# Patient Record
Sex: Female | Born: 1975 | Race: Black or African American | Hispanic: No | Smoking: Former smoker
Health system: Southern US, Community
[De-identification: ages and names within clinical notes are randomized; demographics above are authoritative.]

## PROBLEM LIST (undated history)

## (undated) DIAGNOSIS — F1120 Opioid dependence, uncomplicated: Secondary | ICD-10-CM

## (undated) DIAGNOSIS — C801 Malignant (primary) neoplasm, unspecified: Secondary | ICD-10-CM

## (undated) DIAGNOSIS — C50919 Malignant neoplasm of unspecified site of unspecified female breast: Secondary | ICD-10-CM

## (undated) DIAGNOSIS — Z8042 Family history of malignant neoplasm of prostate: Secondary | ICD-10-CM

## (undated) DIAGNOSIS — Z9289 Personal history of other medical treatment: Secondary | ICD-10-CM

## (undated) DIAGNOSIS — F319 Bipolar disorder, unspecified: Secondary | ICD-10-CM

## (undated) DIAGNOSIS — D649 Anemia, unspecified: Secondary | ICD-10-CM

## (undated) HISTORY — DX: Bipolar disorder, unspecified: F31.9

## (undated) HISTORY — DX: Family history of malignant neoplasm of prostate: Z80.42

## (undated) HISTORY — DX: Opioid dependence, uncomplicated: F11.20

## (undated) HISTORY — DX: Anemia, unspecified: D64.9

## (undated) HISTORY — DX: Personal history of other medical treatment: Z92.89

## (undated) HISTORY — DX: Malignant neoplasm of unspecified site of unspecified female breast: C50.919

## (undated) HISTORY — PX: DILATION AND CURETTAGE OF UTERUS: SHX78

---

## 1997-09-26 ENCOUNTER — Inpatient Hospital Stay (HOSPITAL_COMMUNITY): Admission: AD | Admit: 1997-09-26 | Discharge: 1997-09-30 | Payer: Self-pay | Admitting: Obstetrics

## 1998-06-13 ENCOUNTER — Inpatient Hospital Stay (HOSPITAL_COMMUNITY): Admission: AD | Admit: 1998-06-13 | Discharge: 1998-06-15 | Payer: Self-pay | Admitting: *Deleted

## 1999-04-06 ENCOUNTER — Emergency Department (HOSPITAL_COMMUNITY): Admission: EM | Admit: 1999-04-06 | Discharge: 1999-04-06 | Payer: Self-pay | Admitting: Emergency Medicine

## 2000-02-10 ENCOUNTER — Emergency Department (HOSPITAL_COMMUNITY): Admission: EM | Admit: 2000-02-10 | Discharge: 2000-02-10 | Payer: Self-pay | Admitting: Emergency Medicine

## 2000-06-04 ENCOUNTER — Ambulatory Visit (HOSPITAL_COMMUNITY): Admission: RE | Admit: 2000-06-04 | Discharge: 2000-06-04 | Payer: Self-pay | Admitting: Internal Medicine

## 2000-06-04 ENCOUNTER — Encounter: Payer: Self-pay | Admitting: Internal Medicine

## 2000-07-18 ENCOUNTER — Inpatient Hospital Stay (HOSPITAL_COMMUNITY): Admission: AD | Admit: 2000-07-18 | Discharge: 2000-07-18 | Payer: Self-pay | Admitting: Obstetrics & Gynecology

## 2000-07-21 ENCOUNTER — Inpatient Hospital Stay (HOSPITAL_COMMUNITY): Admission: AD | Admit: 2000-07-21 | Discharge: 2000-07-21 | Payer: Self-pay | Admitting: Obstetrics

## 2000-07-23 ENCOUNTER — Inpatient Hospital Stay (HOSPITAL_COMMUNITY): Admission: AD | Admit: 2000-07-23 | Discharge: 2000-07-23 | Payer: Self-pay | Admitting: *Deleted

## 2000-07-27 ENCOUNTER — Inpatient Hospital Stay (HOSPITAL_COMMUNITY): Admission: AD | Admit: 2000-07-27 | Discharge: 2000-07-27 | Payer: Self-pay | Admitting: Obstetrics

## 2000-07-27 ENCOUNTER — Encounter: Payer: Self-pay | Admitting: *Deleted

## 2000-08-01 ENCOUNTER — Inpatient Hospital Stay (HOSPITAL_COMMUNITY): Admission: AD | Admit: 2000-08-01 | Discharge: 2000-08-01 | Payer: Self-pay | Admitting: Obstetrics

## 2000-09-11 ENCOUNTER — Inpatient Hospital Stay (HOSPITAL_COMMUNITY): Admission: AD | Admit: 2000-09-11 | Discharge: 2000-09-11 | Payer: Self-pay | Admitting: *Deleted

## 2000-11-05 ENCOUNTER — Inpatient Hospital Stay (HOSPITAL_COMMUNITY): Admission: AD | Admit: 2000-11-05 | Discharge: 2000-11-05 | Payer: Self-pay | Admitting: *Deleted

## 2000-11-08 ENCOUNTER — Ambulatory Visit (HOSPITAL_COMMUNITY): Admission: RE | Admit: 2000-11-08 | Discharge: 2000-11-08 | Payer: Self-pay | Admitting: Obstetrics

## 2001-01-31 ENCOUNTER — Inpatient Hospital Stay (HOSPITAL_COMMUNITY): Admission: AD | Admit: 2001-01-31 | Discharge: 2001-01-31 | Payer: Self-pay | Admitting: Obstetrics & Gynecology

## 2001-01-31 ENCOUNTER — Encounter: Payer: Self-pay | Admitting: Obstetrics & Gynecology

## 2001-02-27 ENCOUNTER — Inpatient Hospital Stay (HOSPITAL_COMMUNITY): Admission: AD | Admit: 2001-02-27 | Discharge: 2001-02-27 | Payer: Self-pay | Admitting: Obstetrics & Gynecology

## 2001-03-05 ENCOUNTER — Inpatient Hospital Stay (HOSPITAL_COMMUNITY): Admission: AD | Admit: 2001-03-05 | Discharge: 2001-03-08 | Payer: Self-pay | Admitting: Obstetrics

## 2001-03-10 ENCOUNTER — Inpatient Hospital Stay (HOSPITAL_COMMUNITY): Admission: AD | Admit: 2001-03-10 | Discharge: 2001-03-10 | Payer: Self-pay | Admitting: Obstetrics & Gynecology

## 2002-02-06 ENCOUNTER — Encounter: Payer: Self-pay | Admitting: Emergency Medicine

## 2002-02-06 ENCOUNTER — Emergency Department (HOSPITAL_COMMUNITY): Admission: EM | Admit: 2002-02-06 | Discharge: 2002-02-07 | Payer: Self-pay | Admitting: Emergency Medicine

## 2002-06-19 ENCOUNTER — Inpatient Hospital Stay (HOSPITAL_COMMUNITY): Admission: AD | Admit: 2002-06-19 | Discharge: 2002-06-19 | Payer: Self-pay | Admitting: *Deleted

## 2003-06-19 ENCOUNTER — Emergency Department (HOSPITAL_COMMUNITY): Admission: EM | Admit: 2003-06-19 | Discharge: 2003-06-19 | Payer: Self-pay | Admitting: Emergency Medicine

## 2003-10-10 ENCOUNTER — Inpatient Hospital Stay (HOSPITAL_COMMUNITY): Admission: AD | Admit: 2003-10-10 | Discharge: 2003-10-10 | Payer: Self-pay | Admitting: Family Medicine

## 2003-10-25 ENCOUNTER — Inpatient Hospital Stay (HOSPITAL_COMMUNITY): Admission: RE | Admit: 2003-10-25 | Discharge: 2003-10-25 | Payer: Self-pay | Admitting: Family Medicine

## 2003-12-09 ENCOUNTER — Inpatient Hospital Stay (HOSPITAL_COMMUNITY): Admission: AD | Admit: 2003-12-09 | Discharge: 2003-12-09 | Payer: Self-pay | Admitting: Obstetrics & Gynecology

## 2003-12-25 ENCOUNTER — Emergency Department (HOSPITAL_COMMUNITY): Admission: EM | Admit: 2003-12-25 | Discharge: 2003-12-25 | Payer: Self-pay | Admitting: Emergency Medicine

## 2004-03-07 ENCOUNTER — Ambulatory Visit (HOSPITAL_COMMUNITY): Admission: RE | Admit: 2004-03-07 | Discharge: 2004-03-07 | Payer: Self-pay | Admitting: *Deleted

## 2004-06-06 ENCOUNTER — Inpatient Hospital Stay (HOSPITAL_COMMUNITY): Admission: AD | Admit: 2004-06-06 | Discharge: 2004-06-07 | Payer: Self-pay | Admitting: *Deleted

## 2004-06-12 ENCOUNTER — Ambulatory Visit: Payer: Self-pay | Admitting: Obstetrics & Gynecology

## 2004-06-12 ENCOUNTER — Inpatient Hospital Stay (HOSPITAL_COMMUNITY): Admission: AD | Admit: 2004-06-12 | Discharge: 2004-06-15 | Payer: Self-pay | Admitting: Obstetrics & Gynecology

## 2004-09-14 ENCOUNTER — Ambulatory Visit: Payer: Self-pay | Admitting: Family Medicine

## 2004-12-04 ENCOUNTER — Ambulatory Visit: Payer: Self-pay | Admitting: Family Medicine

## 2005-03-06 ENCOUNTER — Ambulatory Visit: Payer: Self-pay | Admitting: Family Medicine

## 2005-05-30 ENCOUNTER — Ambulatory Visit: Payer: Self-pay | Admitting: Family Medicine

## 2005-07-18 ENCOUNTER — Emergency Department (HOSPITAL_COMMUNITY): Admission: EM | Admit: 2005-07-18 | Discharge: 2005-07-18 | Payer: Self-pay | Admitting: Emergency Medicine

## 2005-08-24 ENCOUNTER — Ambulatory Visit: Payer: Self-pay | Admitting: Sports Medicine

## 2005-12-11 ENCOUNTER — Ambulatory Visit: Payer: Self-pay | Admitting: Sports Medicine

## 2005-12-23 ENCOUNTER — Encounter (INDEPENDENT_AMBULATORY_CARE_PROVIDER_SITE_OTHER): Payer: Self-pay | Admitting: *Deleted

## 2005-12-23 LAB — CONVERTED CEMR LAB

## 2005-12-25 ENCOUNTER — Ambulatory Visit: Payer: Self-pay | Admitting: Sports Medicine

## 2005-12-27 ENCOUNTER — Other Ambulatory Visit: Admission: RE | Admit: 2005-12-27 | Discharge: 2005-12-27 | Payer: Self-pay | Admitting: Family Medicine

## 2005-12-27 ENCOUNTER — Ambulatory Visit: Payer: Self-pay | Admitting: Family Medicine

## 2006-07-26 ENCOUNTER — Ambulatory Visit: Payer: Self-pay | Admitting: Family Medicine

## 2006-08-22 DIAGNOSIS — F1120 Opioid dependence, uncomplicated: Secondary | ICD-10-CM | POA: Insufficient documentation

## 2006-08-23 ENCOUNTER — Encounter (INDEPENDENT_AMBULATORY_CARE_PROVIDER_SITE_OTHER): Payer: Self-pay | Admitting: *Deleted

## 2006-10-28 ENCOUNTER — Ambulatory Visit: Payer: Self-pay | Admitting: Sports Medicine

## 2006-10-28 LAB — CONVERTED CEMR LAB: Beta hcg, urine, semiquantitative: NEGATIVE

## 2006-12-26 ENCOUNTER — Ambulatory Visit: Payer: Self-pay | Admitting: Family Medicine

## 2007-01-15 ENCOUNTER — Ambulatory Visit: Payer: Self-pay | Admitting: Family Medicine

## 2007-03-05 ENCOUNTER — Telehealth: Payer: Self-pay | Admitting: *Deleted

## 2007-03-05 ENCOUNTER — Ambulatory Visit: Payer: Self-pay | Admitting: Family Medicine

## 2007-03-12 ENCOUNTER — Telehealth: Payer: Self-pay | Admitting: *Deleted

## 2007-03-13 ENCOUNTER — Encounter (INDEPENDENT_AMBULATORY_CARE_PROVIDER_SITE_OTHER): Payer: Self-pay | Admitting: Family Medicine

## 2007-03-13 ENCOUNTER — Ambulatory Visit: Payer: Self-pay | Admitting: Family Medicine

## 2007-03-13 LAB — CONVERTED CEMR LAB
Basophils Absolute: 0 10*3/uL (ref 0.0–0.1)
Basophils Relative: 0 % (ref 0–1)
Eosinophils Absolute: 0.1 10*3/uL (ref 0.0–0.7)
Hepatitis B Surface Ag: NEGATIVE
Lymphocytes Relative: 25 % (ref 12–46)
MCHC: 34.5 g/dL (ref 30.0–36.0)
Monocytes Relative: 5 % (ref 3–11)
Neutrophils Relative %: 68 % (ref 43–77)
RDW: 13.1 % (ref 11.5–14.0)
WBC: 7.6 10*3/uL (ref 4.0–10.5)

## 2007-03-14 ENCOUNTER — Telehealth (INDEPENDENT_AMBULATORY_CARE_PROVIDER_SITE_OTHER): Payer: Self-pay | Admitting: Family Medicine

## 2007-03-17 ENCOUNTER — Encounter (INDEPENDENT_AMBULATORY_CARE_PROVIDER_SITE_OTHER): Payer: Self-pay | Admitting: Family Medicine

## 2007-03-24 ENCOUNTER — Ambulatory Visit: Payer: Self-pay | Admitting: Sports Medicine

## 2007-04-27 ENCOUNTER — Encounter: Payer: Self-pay | Admitting: Family Medicine

## 2007-10-06 ENCOUNTER — Telehealth: Payer: Self-pay | Admitting: *Deleted

## 2007-10-07 ENCOUNTER — Other Ambulatory Visit: Admission: RE | Admit: 2007-10-07 | Discharge: 2007-10-07 | Payer: Self-pay | Admitting: Family Medicine

## 2007-10-07 ENCOUNTER — Ambulatory Visit: Payer: Self-pay | Admitting: Family Medicine

## 2007-10-07 ENCOUNTER — Encounter (INDEPENDENT_AMBULATORY_CARE_PROVIDER_SITE_OTHER): Payer: Self-pay | Admitting: Family Medicine

## 2007-10-07 LAB — CONVERTED CEMR LAB
Beta hcg, urine, semiquantitative: NEGATIVE
Chlamydia, DNA Probe: NEGATIVE
Urobilinogen, UA: 0.2
Whiff Test: POSITIVE
pH: 6

## 2007-10-08 ENCOUNTER — Encounter (INDEPENDENT_AMBULATORY_CARE_PROVIDER_SITE_OTHER): Payer: Self-pay | Admitting: Family Medicine

## 2007-10-10 LAB — CONVERTED CEMR LAB: Pap Smear: ABNORMAL

## 2007-12-15 ENCOUNTER — Telehealth: Payer: Self-pay | Admitting: *Deleted

## 2007-12-16 ENCOUNTER — Encounter: Payer: Self-pay | Admitting: Family Medicine

## 2007-12-16 ENCOUNTER — Ambulatory Visit: Payer: Self-pay | Admitting: Family Medicine

## 2007-12-16 LAB — CONVERTED CEMR LAB
Beta hcg, urine, semiquantitative: POSITIVE
Blood in Urine, dipstick: NEGATIVE
Glucose, Urine, Semiquant: NEGATIVE
Ketones, urine, test strip: NEGATIVE
Nitrite: NEGATIVE
Protein, U semiquant: NEGATIVE
Specific Gravity, Urine: 1.01
Urobilinogen, UA: 0.2
pH: 6.5

## 2007-12-19 ENCOUNTER — Telehealth: Payer: Self-pay | Admitting: Family Medicine

## 2008-01-02 ENCOUNTER — Telehealth: Payer: Self-pay | Admitting: Family Medicine

## 2008-01-05 ENCOUNTER — Encounter: Payer: Self-pay | Admitting: *Deleted

## 2008-01-07 ENCOUNTER — Encounter: Payer: Self-pay | Admitting: Family Medicine

## 2008-02-22 ENCOUNTER — Inpatient Hospital Stay (HOSPITAL_COMMUNITY): Admission: AD | Admit: 2008-02-22 | Discharge: 2008-02-22 | Payer: Self-pay | Admitting: Obstetrics & Gynecology

## 2008-02-22 LAB — CONVERTED CEMR LAB
Antibody Screen: NEGATIVE
Chlamydia, DNA Probe: NEGATIVE
GC Culture Only: NEGATIVE
HCT: 36.3 %
Hemoglobin: 12.3 g/dL
Rh Type: POSITIVE

## 2008-04-29 ENCOUNTER — Ambulatory Visit: Payer: Self-pay | Admitting: Family Medicine

## 2008-04-29 LAB — CONVERTED CEMR LAB
GTT: NORMAL
Glucose, Urine, Semiquant: NEGATIVE

## 2008-05-13 ENCOUNTER — Ambulatory Visit: Payer: Self-pay | Admitting: Family Medicine

## 2008-05-13 ENCOUNTER — Encounter: Payer: Self-pay | Admitting: Family Medicine

## 2008-05-13 LAB — CONVERTED CEMR LAB: Hemoglobin: 11 g/dL

## 2008-05-14 ENCOUNTER — Encounter: Payer: Self-pay | Admitting: Family Medicine

## 2008-05-16 LAB — CONVERTED CEMR LAB

## 2008-05-24 ENCOUNTER — Encounter: Payer: Self-pay | Admitting: *Deleted

## 2008-06-10 ENCOUNTER — Ambulatory Visit: Payer: Self-pay | Admitting: Family Medicine

## 2008-06-10 DIAGNOSIS — F319 Bipolar disorder, unspecified: Secondary | ICD-10-CM

## 2008-07-08 ENCOUNTER — Encounter: Payer: Self-pay | Admitting: Family Medicine

## 2008-07-08 ENCOUNTER — Ambulatory Visit: Payer: Self-pay | Admitting: Family Medicine

## 2008-07-08 DIAGNOSIS — R252 Cramp and spasm: Secondary | ICD-10-CM | POA: Insufficient documentation

## 2008-07-09 LAB — CONVERTED CEMR LAB: GC Probe Amp, Genital: NEGATIVE

## 2008-07-16 ENCOUNTER — Ambulatory Visit: Payer: Self-pay | Admitting: Family Medicine

## 2008-07-23 ENCOUNTER — Ambulatory Visit: Payer: Self-pay | Admitting: Family Medicine

## 2008-07-28 ENCOUNTER — Ambulatory Visit: Payer: Self-pay | Admitting: Family Medicine

## 2008-07-29 ENCOUNTER — Ambulatory Visit: Payer: Self-pay | Admitting: Obstetrics and Gynecology

## 2008-07-29 ENCOUNTER — Inpatient Hospital Stay (HOSPITAL_COMMUNITY): Admission: AD | Admit: 2008-07-29 | Discharge: 2008-07-31 | Payer: Self-pay | Admitting: Obstetrics & Gynecology

## 2008-07-29 ENCOUNTER — Telehealth: Payer: Self-pay | Admitting: *Deleted

## 2008-08-03 ENCOUNTER — Inpatient Hospital Stay (HOSPITAL_COMMUNITY): Admission: AD | Admit: 2008-08-03 | Discharge: 2008-08-03 | Payer: Self-pay | Admitting: Obstetrics & Gynecology

## 2008-08-03 ENCOUNTER — Telehealth: Payer: Self-pay | Admitting: Family Medicine

## 2008-08-06 ENCOUNTER — Ambulatory Visit: Payer: Self-pay | Admitting: Family Medicine

## 2008-08-06 ENCOUNTER — Inpatient Hospital Stay (HOSPITAL_COMMUNITY): Admission: AD | Admit: 2008-08-06 | Discharge: 2008-08-06 | Payer: Self-pay | Admitting: Obstetrics & Gynecology

## 2008-08-12 ENCOUNTER — Ambulatory Visit: Payer: Self-pay | Admitting: Family Medicine

## 2009-03-12 ENCOUNTER — Encounter (INDEPENDENT_AMBULATORY_CARE_PROVIDER_SITE_OTHER): Payer: Self-pay | Admitting: *Deleted

## 2009-03-12 DIAGNOSIS — F17200 Nicotine dependence, unspecified, uncomplicated: Secondary | ICD-10-CM | POA: Insufficient documentation

## 2010-07-16 ENCOUNTER — Encounter: Payer: Self-pay | Admitting: Family Medicine

## 2010-08-11 ENCOUNTER — Encounter: Payer: Self-pay | Admitting: *Deleted

## 2010-10-10 LAB — CBC
HCT: 38.5 % (ref 36.0–46.0)
MCHC: 33.5 g/dL (ref 30.0–36.0)
MCHC: 34.3 g/dL (ref 30.0–36.0)
MCV: 101.6 fL — ABNORMAL HIGH (ref 78.0–100.0)
RBC: 3.82 MIL/uL — ABNORMAL LOW (ref 3.87–5.11)
RDW: 14 % (ref 11.5–15.5)
RDW: 14.3 % (ref 11.5–15.5)

## 2010-10-10 LAB — GC/CHLAMYDIA PROBE AMP, GENITAL
Chlamydia, DNA Probe: NEGATIVE
GC Probe Amp, Genital: NEGATIVE

## 2010-10-10 LAB — WET PREP, GENITAL
Clue Cells Wet Prep HPF POC: NONE SEEN
Trich, Wet Prep: NONE SEEN
Yeast Wet Prep HPF POC: NONE SEEN

## 2010-10-10 LAB — RAPID URINE DRUG SCREEN, HOSP PERFORMED
Benzodiazepines: NOT DETECTED
Tetrahydrocannabinol: POSITIVE — AB

## 2010-11-07 NOTE — Group Therapy Note (Signed)
NAME:  MALLIE, GIAMBRA NO.:  [id number]   MEDICAL RECORD NO.:  [id number]          PATIENT TYPE:  WOC   LOCATION:  [facility]                   FACILITY:  [facility]   PHYSICIAN:  Odie Sera, D.O.    DATE OF BIRTH:  1976/05/21   DATE OF SERVICE:                                  CLINIC NOTE   REASON FOR VISIT:  Follow up on perineal laceration.   HISTORY OF PRESENT ILLNESS:  Ms. Ardie Dragoo is a 35 year old G6, para  4-0-2-4 who is status post normal spontaneous vaginal delivery on  July 29, 2008.  She had a first-degree laceration which apparently  opened up and was previously seen in [facility] where initial attempts were  made to Dermabond the incision.  This was not successful and the patient  returned and had some sutures placed.  She presents here today for  followup.   PHYSICAL EXAMINATION:  The patient is healthy-appearing in no acute  distress.  Her blood pressure and vitals are in the chart and are  stable.  Her vaginal exam reveals normal external female genitalia.  The  area on the left side that had previously separated is a little better  approximated, although there is still a small area of separation at the  base of the laceration.   ASSESSMENT/PLAN:  1. Perineal laceration healing.  I had a lengthy discussion with Ms.      Cheree Ditto regarding wound care in terms of sitz baths daily and      avoidance of intercourse as this will most likely take 3-4 weeks to      heal.  2. Contraceptive counseling.  We also had a lengthy discussion      regarding her options for contraception.  The patient initially was      interested in tubal ligation, but after we discussed the risks and      benefits, the patient thought she might be more interested in      [place].  I discussed risks and benefits of this as well as an      option of Implanon.  The patient has decided she wants to think      about her contraception and will come back in 4 weeks for her  postpartum evaluation and we will further discuss her contraception      at that time.           ______________________________  Odie Sera, D.O.     MC/MEDQ  D:  08/12/2008  T:  08/13/2008  Job:  [id number]

## 2010-11-10 ENCOUNTER — Encounter: Payer: Self-pay | Admitting: Family Medicine

## 2010-11-14 ENCOUNTER — Ambulatory Visit: Payer: Self-pay | Admitting: Family Medicine

## 2010-12-11 ENCOUNTER — Encounter: Payer: Self-pay | Admitting: Family Medicine

## 2010-12-12 ENCOUNTER — Other Ambulatory Visit (HOSPITAL_COMMUNITY)
Admission: RE | Admit: 2010-12-12 | Discharge: 2010-12-12 | Disposition: A | Discharge status: Home / Self Care | Payer: Medicaid Other | Source: Ambulatory Visit | Attending: Family Medicine | Admitting: Family Medicine

## 2010-12-12 ENCOUNTER — Ambulatory Visit (INDEPENDENT_AMBULATORY_CARE_PROVIDER_SITE_OTHER): Payer: Medicaid Other | Admitting: Family Medicine

## 2010-12-12 ENCOUNTER — Encounter: Payer: Self-pay | Admitting: Family Medicine

## 2010-12-12 DIAGNOSIS — Z124 Encounter for screening for malignant neoplasm of cervix: Secondary | ICD-10-CM

## 2010-12-12 DIAGNOSIS — N760 Acute vaginitis: Secondary | ICD-10-CM

## 2010-12-12 DIAGNOSIS — IMO0001 Reserved for inherently not codable concepts without codable children: Secondary | ICD-10-CM

## 2010-12-12 DIAGNOSIS — Z01419 Encounter for gynecological examination (general) (routine) without abnormal findings: Secondary | ICD-10-CM | POA: Insufficient documentation

## 2010-12-12 DIAGNOSIS — Z309 Encounter for contraceptive management, unspecified: Secondary | ICD-10-CM

## 2010-12-12 MED ORDER — MEDROXYPROGESTERONE ACETATE 150 MG/ML IM SUSP
150.0000 mg | Freq: Once | INTRAMUSCULAR | Status: AC
  Administered 2010-12-12: 150 mg via INTRAMUSCULAR

## 2010-12-12 NOTE — Patient Instructions (Addendum)
It was great to see you today! We will draw some blood to check you for any sexually transmitted infections.  I will call you if any of the results are abnormal, otherwise I will send you a letter telling you that things are fine. I would like for you to schedule and appointment in 3-4 weeks for [place] to go over your history and see if there are any things that we need to do to check on your general health. We will give you your depo shot today.

## 2010-12-13 LAB — GC/CHLAMYDIA PROBE AMP, GENITAL: GC Probe Amp, Genital: NEGATIVE

## 2010-12-13 NOTE — Progress Notes (Signed)
Subjective: The patient presents today specifically for a Pap smear and STD testing.  She reports that she is been seeing another physician for the past two years due to some differences of opinion with her previous position at the [facility].  Now that that physician is no longer here, she plans to resume her care with Amber Villarreal.  She does not report any significant medical problems, and is currently sexually active.  She has been using nothing for contraception for the last nine months, and has been having regular periods.  She reports that she is currently having one, as of today.  No complaints of vaginal discharge, abdominal tenderness, problems with urination, or problems with bowel movements.  Objective:  Filed Vitals:   12/12/10 1507  BP: 117/81  Pulse: 94  Temp: 97.9 F (36.6 C)   General: no acute distress, cooperative throughout the exam. Pelvic: normal external genitalia, no notable vaginal discharge.  There is slight bleeding from cervical off, however the cervix is not friable.  Pap smear along with a GC/Chlamydia were obtained without incident.  Bimanual exam shows small uterus without any tenderness.  No adnexal tenderness.  Assessment and plan: Discussed with the patient's different options of contraception.  Discussed the risks and benefits of multiple methods.  After this discussion the patient elected to use Depo-Provera, which he is been on in the past.  Counsel the patient about the long-term risks of use of this contraception.  Urine pregnancy test is negative so we will perform her first injection today.  As the patient has not been seen in approximately 2 years, we discussed having the patient return for another visit in which is we can discuss the patient's medical and obstetrical history in more detail, as well as discussed appropriate health maintenance.

## 2010-12-14 ENCOUNTER — Encounter: Payer: Self-pay | Admitting: Family Medicine

## 2010-12-19 ENCOUNTER — Encounter: Payer: Self-pay | Admitting: Family Medicine

## 2011-03-07 ENCOUNTER — Ambulatory Visit (INDEPENDENT_AMBULATORY_CARE_PROVIDER_SITE_OTHER): Payer: Medicaid Other | Admitting: *Deleted

## 2011-03-07 DIAGNOSIS — Z309 Encounter for contraceptive management, unspecified: Secondary | ICD-10-CM

## 2011-03-07 MED ORDER — MEDROXYPROGESTERONE ACETATE 150 MG/ML IM SUSP
150.0000 mg | Freq: Once | INTRAMUSCULAR | Status: AC
  Administered 2011-03-07: 150 mg via INTRAMUSCULAR

## 2011-03-14 ENCOUNTER — Ambulatory Visit: Payer: Medicaid Other | Admitting: Family Medicine

## 2011-03-27 LAB — GLUCOSE, CAPILLARY: Glucose-Capillary: 113 — ABNORMAL HIGH

## 2011-05-11 ENCOUNTER — Encounter: Payer: Self-pay | Admitting: Family Medicine

## 2011-05-11 ENCOUNTER — Ambulatory Visit (INDEPENDENT_AMBULATORY_CARE_PROVIDER_SITE_OTHER): Payer: Medicaid Other | Admitting: Family Medicine

## 2011-05-11 VITALS — BP 147/98 | HR 92 | Temp 98.5°F | Ht 70.0 in | Wt 208.0 lb

## 2011-05-11 DIAGNOSIS — R002 Palpitations: Secondary | ICD-10-CM | POA: Insufficient documentation

## 2011-05-11 DIAGNOSIS — F319 Bipolar disorder, unspecified: Secondary | ICD-10-CM

## 2011-05-11 NOTE — Patient Instructions (Addendum)
Keep taking aspirin if it helps with your pain Will get labwork today Consider ways that working on stress may be able to help with your symptoms Make an appointment with your doctor- Dr. Louanne Belton to discuss results of your labwork and get your physical exam.

## 2011-05-11 NOTE — Assessment & Plan Note (Signed)
This is likely related to mood disorder and does not appear to be cardiac related at this time. I reassured patient that I did not think this was a sign of eminent death. Will draw fasting lab work, CBC, TSH today

## 2011-05-11 NOTE — Progress Notes (Signed)
Subjective:    Patient ID: Amber Villarreal, female    DOB: [birth year], 35 y.o.   MRN: [id number]  HPI  Here to discus fear she is going to die,  pain in chest and "organs" and back.  Is worried about having Reye syndrome because she is taking aspirin.  Also worried about itchy papules on legs, having a heart attack due to her family history of early CAD, liver damage, and cancer.  Would like to "get everything checked"  Rash: started on feet 1 month ago, then goes away.  Very itchy, on legs.  Notes worsenign acne as well.  Scattered papules come and go on her legs.  No pus drainage, infection, new contact allergens.  Chest pain and papliations:  Started 2 years ago with the birth of her last son, but later states it has been happening since age 60, once every few months. Inconsistent as she later reports pain constantly throughout the day and had several episodes while in the office.   Pain is worse when she gets depo or when she is due for another depo shot.  Feels like tightness and has difficulty breathing.  Feels like she is "going to die"  Takes an aspirin and she feels better.  Wakes up with a tightness.  She states she has dreams which she has died and left her children without anyone to care for them. There is no mechanism of death revealed in her dream.  In reviewing her past medical history I asked her about her history of bipolar she states this has nothing to do with her symptoms and she does not want further treatment. She states that she believes that the lithium that she is to be on may have caused her heart problems.  Review of Systems  Constitutional: Positive for fatigue. Negative for fever and appetite change.  HENT: Positive for neck pain. Negative for sore throat.   Respiratory: Positive for chest tightness and shortness of breath.   Cardiovascular: Positive for chest pain and palpitations.  Gastrointestinal: Positive for abdominal pain and abdominal distention. Negative for  nausea, vomiting, diarrhea and constipation.  Musculoskeletal: Positive for myalgias and back pain.  Psychiatric/Behavioral: Negative for dysphoric mood. The patient is not nervous/anxious.        Objective:   Physical Exam  Constitutional: She is oriented to [place], place, and time. She appears well-developed and well-nourished.  Eyes: EOM are normal. Pupils are equal, round, and reactive to light.  Neck: Neck supple. No thyromegaly present.  Cardiovascular: Normal rate and regular rhythm.   No murmur heard. Pulmonary/Chest: Effort normal and breath sounds normal. No respiratory distress. She has no wheezes.  Abdominal: Soft. Bowel sounds are normal. She exhibits no distension. There is no tenderness. There is no rebound and no guarding.  Musculoskeletal: She exhibits no edema.  Lymphadenopathy:    She has no cervical adenopathy.  Neurological: She is alert and oriented to [place], place, and time.  Psychiatric: Her mood appears anxious. Her affect is labile. Her speech is rapid and/or pressured. She is agitated. She is not actively hallucinating. Thought content is paranoid. Cognition and memory are normal. She does not express inappropriate judgment. She expresses no homicidal and no suicidal ideation.   I witnessed an episode which she states is what she was trying to describe. She was very anxious but otherwise no tachypnea, tachycardia. This only lasted several seconds before she continued to talk as previous.       Assessment & Plan:

## 2011-05-11 NOTE — Assessment & Plan Note (Signed)
I discussed with the patient that her symptoms may be related to panic and poorly controlled mood disorder. She is very resistant to this idea at this time. She appears to be somewhat hypomanic today.  I advised patient to schedule appointment for next week for annual physical exam and that time we will review her lab work. I implored her to consider the role of stress in bringing on many of her symptoms

## 2011-05-12 LAB — CBC WITH DIFFERENTIAL/PLATELET
Basophils Absolute: 0 10*3/uL (ref 0.0–0.1)
Basophils Relative: 0 % (ref 0–1)
Eosinophils Relative: 1 % (ref 0–5)
HCT: 40.7 % (ref 36.0–46.0)
MCHC: 33.4 g/dL (ref 30.0–36.0)
MCV: 97.1 fL (ref 78.0–100.0)
Monocytes Absolute: 0.5 10*3/uL (ref 0.1–1.0)
Neutro Abs: 4.8 10*3/uL (ref 1.7–7.7)
RDW: 12.9 % (ref 11.5–15.5)

## 2011-05-12 LAB — COMPREHENSIVE METABOLIC PANEL
ALT: 34 U/L (ref 0–35)
AST: 19 U/L (ref 0–37)
Calcium: 9.3 mg/dL (ref 8.4–10.5)
Chloride: 107 mEq/L (ref 96–112)
Creat: 0.8 mg/dL (ref 0.50–1.10)
Potassium: 4.1 mEq/L (ref 3.5–5.3)
Sodium: 140 mEq/L (ref 135–145)
Total Protein: 7.2 g/dL (ref 6.0–8.3)

## 2011-05-12 LAB — TSH: TSH: 0.955 u[IU]/mL (ref 0.350–4.500)

## 2011-05-12 LAB — LIPID PANEL: LDL Cholesterol: 139 mg/dL — ABNORMAL HIGH (ref 0–99)

## 2011-05-16 ENCOUNTER — Ambulatory Visit: Payer: Medicaid Other | Admitting: Family Medicine

## 2011-05-16 ENCOUNTER — Encounter: Payer: Self-pay | Admitting: Family Medicine

## 2011-05-16 DIAGNOSIS — R7301 Impaired fasting glucose: Secondary | ICD-10-CM | POA: Insufficient documentation

## 2011-05-28 ENCOUNTER — Ambulatory Visit: Payer: Medicaid Other

## 2011-06-01 ENCOUNTER — Encounter: Payer: Self-pay | Admitting: Family Medicine

## 2011-06-01 ENCOUNTER — Ambulatory Visit (INDEPENDENT_AMBULATORY_CARE_PROVIDER_SITE_OTHER): Payer: Medicaid Other | Admitting: Family Medicine

## 2011-06-01 VITALS — BP 130/92 | HR 110 | Ht 70.0 in | Wt 214.0 lb

## 2011-06-01 DIAGNOSIS — R7301 Impaired fasting glucose: Secondary | ICD-10-CM

## 2011-06-01 DIAGNOSIS — Z23 Encounter for immunization: Secondary | ICD-10-CM

## 2011-06-01 DIAGNOSIS — Z309 Encounter for contraceptive management, unspecified: Secondary | ICD-10-CM

## 2011-06-01 DIAGNOSIS — E669 Obesity, unspecified: Secondary | ICD-10-CM

## 2011-06-01 DIAGNOSIS — F319 Bipolar disorder, unspecified: Secondary | ICD-10-CM

## 2011-06-01 MED ORDER — MEDROXYPROGESTERONE ACETATE 150 MG/ML IM SUSP
150.0000 mg | Freq: Once | INTRAMUSCULAR | Status: AC
  Administered 2011-06-01: 150 mg via INTRAMUSCULAR

## 2011-06-01 NOTE — Assessment & Plan Note (Signed)
We discussed lifestyle modification. She declines referral to nutrition and weight management. She identified increasing physical activity and increasing been number of green vegetables in her diet as to intervention she can followup with. I plan to followup with her in less than 6 months to reevaluate

## 2011-06-01 NOTE — Assessment & Plan Note (Signed)
Patient unable to see connection between labile mood and her health. Will continue to address at subsequent visits

## 2011-06-01 NOTE — Assessment & Plan Note (Signed)
Counseled on lifestyle management. Declines referral to nutrition and weight management.

## 2011-06-01 NOTE — Patient Instructions (Signed)
Lifestyle goals you made:  1.  Eat green vegetable 3 times per week 2. Increase walking  Follow-up in 6 months

## 2011-06-01 NOTE — Progress Notes (Signed)
  Subjective:    Patient ID: Amber Villarreal, female    DOB: 05-28-1976, 35 y.o.   MRN: [id number]  HPI patient presents today for followup of lab work  Impaired fasting glucose: Patient reports that it was an accurate fasting glucose. She reports no polyuria increased thirst.  Bipolar disorder: Patient admits to labile mood but states she has not" depressed" and does not feel that her mood impacts her health or her lifestyle.  Contraception: Would like to continue with Depo-Provera.  Obesity: Discussed with patient obesity. She finds this difficult to believe and states she has been trying to gain weight. She reports no regular exercise and states she does not eat any vegetables    Review of Systems please see history of present illness     Objective:   Physical Exam GEN: Alert & Oriented, No acute distress CV:  Regular Rate & Rhythm, no murmur Respiratory:  Normal work of breathing, CTAB Abd:  + BS, soft, no tenderness to palpation Ext: no pre-tibial edema Psych:  A & O, impaired judgement.  Pressured speech.       Assessment & Plan:

## 2011-09-28 ENCOUNTER — Ambulatory Visit (INDEPENDENT_AMBULATORY_CARE_PROVIDER_SITE_OTHER): Payer: Medicaid Other | Admitting: *Deleted

## 2011-09-28 DIAGNOSIS — Z3049 Encounter for surveillance of other contraceptives: Secondary | ICD-10-CM

## 2011-09-28 DIAGNOSIS — Z309 Encounter for contraceptive management, unspecified: Secondary | ICD-10-CM

## 2011-10-01 ENCOUNTER — Ambulatory Visit: Payer: Medicaid Other | Admitting: Family Medicine

## 2011-10-01 MED ORDER — MEDROXYPROGESTERONE ACETATE 150 MG/ML IM SUSP: 150.0000 mg | Freq: Once | INTRAMUSCULAR | Status: DC

## 2011-10-01 NOTE — Progress Notes (Signed)
Depo Provera 150 mg  was given on 09/28/2011.  Was not charted at that time.  Unable to chart at this  time.  [employer] lot # [id number] exp date 12/2013. Next depo due June 21 thru December 28, 2011

## 2011-10-08 ENCOUNTER — Ambulatory Visit: Payer: Medicaid Other | Admitting: Family Medicine

## 2011-10-10 ENCOUNTER — Encounter: Payer: Self-pay | Admitting: Family Medicine

## 2011-10-17 ENCOUNTER — Telehealth: Payer: Self-pay | Admitting: *Deleted

## 2011-10-17 NOTE — Telephone Encounter (Signed)
SDA letter was mailed to patient on 10/10/11.  Shanyla Marconi Ann, RN  
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
## 2012-03-28 ENCOUNTER — Encounter (HOSPITAL_COMMUNITY): Payer: Self-pay | Admitting: Emergency Medicine

## 2012-03-28 ENCOUNTER — Emergency Department (HOSPITAL_COMMUNITY): Payer: Medicaid Other

## 2012-03-28 ENCOUNTER — Emergency Department (HOSPITAL_COMMUNITY)
Admission: EM | Admit: 2012-03-28 | Discharge: 2012-03-28 | Disposition: A | Discharge status: Home / Self Care | Payer: Medicaid Other | Attending: Emergency Medicine | Admitting: Emergency Medicine

## 2012-03-28 DIAGNOSIS — Y998 Other external cause status: Secondary | ICD-10-CM | POA: Insufficient documentation

## 2012-03-28 DIAGNOSIS — T07XXXA Unspecified multiple injuries, initial encounter: Secondary | ICD-10-CM | POA: Insufficient documentation

## 2012-03-28 DIAGNOSIS — Z87891 Personal history of nicotine dependence: Secondary | ICD-10-CM | POA: Insufficient documentation

## 2012-03-28 DIAGNOSIS — Y93I9 Activity, other involving external motion: Secondary | ICD-10-CM | POA: Insufficient documentation

## 2012-03-28 MED ORDER — CYCLOBENZAPRINE HCL 10 MG PO TABS: 10.0000 mg | ORAL_TABLET | Freq: Two times a day (BID) | ORAL | Status: DC | PRN

## 2012-03-28 MED ORDER — IBUPROFEN 800 MG PO TABS: 800.0000 mg | ORAL_TABLET | Freq: Three times a day (TID) | ORAL | Status: DC

## 2012-03-28 MED ORDER — IBUPROFEN 400 MG PO TABS
800.0000 mg | ORAL_TABLET | Freq: Once | ORAL | Status: AC
  Administered 2012-03-28: 800 mg via ORAL
  Filled 2012-03-28: qty 2

## 2012-03-28 MED ORDER — HYDROCODONE-ACETAMINOPHEN 5-325 MG PO TABS: 1.0000 | ORAL_TABLET | ORAL | Status: DC | PRN
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
## 2012-03-28 NOTE — ED Provider Notes (Signed)
Medical screening examination/treatment/procedure(s) were performed by non-physician practitioner and as supervising physician I was immediately available for consultation/collaboration.   Lyanne Co, MD 03/28/12 [phone]

## 2012-04-17 ENCOUNTER — Encounter (HOSPITAL_COMMUNITY): Payer: Self-pay

## 2012-04-17 ENCOUNTER — Emergency Department (INDEPENDENT_AMBULATORY_CARE_PROVIDER_SITE_OTHER)
Admission: EM | Admit: 2012-04-17 | Discharge: 2012-04-17 | Disposition: A | Discharge status: Home / Self Care | Payer: Medicaid Other | Source: Home / Self Care | Attending: Emergency Medicine | Admitting: Emergency Medicine

## 2012-04-17 DIAGNOSIS — R079 Chest pain, unspecified: Secondary | ICD-10-CM

## 2012-04-17 DIAGNOSIS — R42 Dizziness and giddiness: Secondary | ICD-10-CM

## 2012-04-17 DIAGNOSIS — R51 Headache: Secondary | ICD-10-CM

## 2012-04-17 LAB — POCT I-STAT, CHEM 8
BUN: 9 mg/dL (ref 6–23)
Calcium, Ion: 1.21 mmol/L (ref 1.12–1.23)
Creatinine, Ser: 1 mg/dL (ref 0.50–1.10)
Glucose, Bld: 89 mg/dL (ref 70–99)
TCO2: 22 mmol/L (ref 0–100)

## 2012-04-17 MED ORDER — GABAPENTIN 300 MG PO CAPS: ORAL_CAPSULE | ORAL | Status: DC

## 2012-04-17 MED ORDER — OMEPRAZOLE 20 MG PO CPDR: 20.0000 mg | DELAYED_RELEASE_CAPSULE | Freq: Every day | ORAL | Status: DC

## 2012-04-17 NOTE — ED Notes (Signed)
Patient states that she has a headache and that she believes it is caused by high blood pressure, she states that there blood pressure was 130/90 at her chiropractor today, today it was 122/85, states that she was a little light headed and dizzy, hypertension sx started 1 year ago

## 2012-04-17 NOTE — ED Provider Notes (Signed)
Chief Complaint  Patient presents with  . Headache    History of Present Illness:   The patient is a 36 year old female who presents with a 3 to four-month history of recurring headaches. She describes left-sided sharp headaches that come and go lasting for a few seconds at a time and in addition at times she has a more long-lasting dull ache on the left side which can last for days at a time. She often wakes up from her sleep with the sharp stabbing headaches. There is nothing that precipitates relieves the headache and it is not associated with nausea, photophobia, or phonophobia. She denies any vision or neurological symptoms associated with the headache. Also for the past 2 months she's felt dizzy, off balance, she sometimes notes sensation of vertigo or that she's about to pass out. She denies any syncope. For the past one year she's had substernal chest pain. This is nonexertional. Sometimes she notes a sharp stabbing pain that lasts for a second and sometimes she feels a tight squeezing sensation that is likewise not exertional. It's not associated with nausea, diaphoresis, or shortness of breath. Sometimes she has some numbness in her left arm but this may occur with or without chest pain. She also notes fatigue and tiredness. Her body feels heavy and weak. She has some blurring of her vision that comes and goes but this is not associated with the headaches. She feels mildly short of breath at times but denies any coughing or wheezing. Sometimes it feels like her heart is racing. She also has nausea and diarrhea. She is concerned about her blood pressure, although her blood pressure today was completely normal. She had a blood pressure done at the chiropractor's office which was a little bit higher but not in the hypertensive range. She's been to her primary care physician several times for these symptoms, and she states that she was told that it was all due to anxiety.  Review of Systems:  Other than  noted above, the patient denies any of the following symptoms: Systemic:  No fever, chills, fatigue, photophobia, stiff neck. Eye:  No redness, eye pain, discharge, blurred vision, or diplopia. ENT:  No nasal congestion, rhinorrhea, sinus pressure or pain, sneezing, earache, or sore throat.  No jaw claudication. Neuro:  No paresthesias, loss of consciousness, seizure activity, muscle weakness, trouble with coordination or gait, trouble speaking or swallowing. Psych:  No depression, anxiety or trouble sleeping.  PMFSH:  Past medical history, family history, social history, meds, and allergies were reviewed.  Physical Exam:   Vital signs:  BP 122/85  Pulse 84  Temp 98 F (36.7 C) (Oral)  Resp 20  SpO2 98%  LMP 03/31/2012 General:  Alert and oriented.  In no distress. Eye:  Lids and conjunctivas normal.  PERRL,  Full EOMs.  Fundi benign with normal discs and vessels. ENT:  No cranial or facial tenderness to palpation.  TMs and canals clear.  Nasal mucosa was normal and uncongested without any drainage. No intra oral lesions, pharynx clear, mucous membranes moist, dentition normal. Neck:  Supple, full ROM, no tenderness to palpation.  No adenopathy or mass. Lungs: Clear to auscultation. Heart: Regular rhythm, no gallop or murmur. Abdomen: Soft, nontender, no organomegaly or mass. Neuro:  Alert and orented times 3.  Speech was clear, fluent, and appropriate.  Cranial nerves intact. No pronator drift, muscle strength normal. Finger to nose normal.  DTRs were 2+ and symmetrical.Station and gait were normal.  Romberg's sign was normal.  Able to perform tandem gait well. Psych:   She was tearful at times, and seemed nervous, anxious, and stressed.   Results for orders placed during the hospital encounter of 04/17/12  POCT I-STAT, CHEM 8      Component Value Range   Sodium 140  135 - 145 mEq/L   Potassium 4.0  3.5 - 5.1 mEq/L   Chloride 106  96 - 112 mEq/L   BUN 9  6 - 23 mg/dL   Creatinine,  Ser 4.09  0.50 - 1.10 mg/dL   Glucose, Bld 89  70 - 99 mg/dL   Calcium, Ion 8.11  9.14 - 1.23 mmol/L   TCO2 22  0 - 100 mmol/L   Hemoglobin 14.6  12.0 - 15.0 g/dL   HCT 78.2  95.6 - 21.3 %    Date: 04/17/2012  Rate: 74  Rhythm: normal sinus rhythm  QRS Axis: normal  Intervals: normal  ST/T Wave abnormalities: normal  Conduction Disutrbances:none  Narrative Interpretation: Normal sinus rhythm, normal EKG.  Old EKG Reviewed: none available  Assessment:  The primary encounter diagnosis was Headache. Diagnoses of Chest pain and Dizziness were also pertinent to this visit.  The patient seems to be very anxious and concerned that these symptoms could be a sign of something serious such as heart disease or neurological disease. Her examination today was completely normal. I have referred her to several specialists for followup. The headaches may be ice pick headaches or due to trigeminal cranial neuralgia. The chest pain sounds very nonspecific and could be due to reflux. The pain does not sound cardiac.   Plan:   1.  The following meds were prescribed:   New Prescriptions   GABAPENTIN (NEURONTIN) 300 MG CAPSULE    1 daily for 3 days, 2 daily for 3 days, then 3 daily, for headache.   OMEPRAZOLE (PRILOSEC) 20 MG CAPSULE    Take 1 capsule (20 mg total) by mouth daily.   2.  The patient was instructed in symptomatic care and handouts were given. 3.  The patient was told to return if becoming worse in any way, if no better in 3 or 4 days, and given some red flag symptoms that would indicate earlier return.  Follow up:  The patient was told to follow up with Dr. Porfirio Mylar Dohmeier for the headaches and with Dr. Marca Ancona for the chest pain.      Reuben Likes, MD 04/17/12 [id number]

## 2012-06-12 ENCOUNTER — Ambulatory Visit (INDEPENDENT_AMBULATORY_CARE_PROVIDER_SITE_OTHER): Payer: Medicaid Other | Admitting: Family Medicine

## 2012-06-12 ENCOUNTER — Encounter: Payer: Self-pay | Admitting: Family Medicine

## 2012-06-12 VITALS — BP 133/85 | HR 89 | Temp 98.3°F | Ht 70.0 in | Wt 205.0 lb

## 2012-06-12 DIAGNOSIS — IMO0001 Reserved for inherently not codable concepts without codable children: Secondary | ICD-10-CM

## 2012-06-12 DIAGNOSIS — R197 Diarrhea, unspecified: Secondary | ICD-10-CM | POA: Insufficient documentation

## 2012-06-12 DIAGNOSIS — Z309 Encounter for contraceptive management, unspecified: Secondary | ICD-10-CM

## 2012-06-12 DIAGNOSIS — F319 Bipolar disorder, unspecified: Secondary | ICD-10-CM

## 2012-06-12 LAB — COMPREHENSIVE METABOLIC PANEL
AST: 14 U/L (ref 0–37)
Alkaline Phosphatase: 61 U/L (ref 39–117)
BUN: 12 mg/dL (ref 6–23)
Calcium: 9.3 mg/dL (ref 8.4–10.5)
Creat: 0.82 mg/dL (ref 0.50–1.10)
Glucose, Bld: 91 mg/dL (ref 70–99)

## 2012-06-12 LAB — CBC WITH DIFFERENTIAL/PLATELET
Basophils Absolute: 0 10*3/uL (ref 0.0–0.1)
Basophils Relative: 0 % (ref 0–1)
Eosinophils Relative: 1 % (ref 0–5)
HCT: 39.9 % (ref 36.0–46.0)
MCH: 31.9 pg (ref 26.0–34.0)
MCHC: 34.6 g/dL (ref 30.0–36.0)
MCV: 92.4 fL (ref 78.0–100.0)
Monocytes Absolute: 0.5 10*3/uL (ref 0.1–1.0)
Monocytes Relative: 6 % (ref 3–12)
RDW: 13.3 % (ref 11.5–15.5)

## 2012-06-12 LAB — POCT URINE PREGNANCY: Preg Test, Ur: NEGATIVE

## 2012-06-12 LAB — TSH: TSH: 1.098 u[IU]/mL (ref 0.350–4.500)

## 2012-06-12 MED ORDER — MEDROXYPROGESTERONE ACETATE 150 MG/ML IM SUSP
150.0000 mg | Freq: Once | INTRAMUSCULAR | Status: AC
  Administered 2012-06-12: 150 mg via INTRAMUSCULAR

## 2012-06-12 NOTE — Patient Instructions (Addendum)
We will be doing labwork to look for signs of infection, serious disease, and parasites.  I am very reassured by your exam today and am not concerned for life threatening condition at this time  I will call you when you lab results return.    We will discuss seeing a GI specialist for your abdominal pain

## 2012-06-12 NOTE — Assessment & Plan Note (Signed)
Patient reports history of bipolar disorder on lithium in childhood.  Some concern for delusions.  No evidence of SI, HI.  When she asked about if risperdal would help her, I agreed that seeking mental health was very important and encouraged her to contact [facility]- gave her info today.  Reassured her we would continue to address somatic symptoms and mental health was an important part of this.

## 2012-06-12 NOTE — Progress Notes (Signed)
  Subjective:    Patient ID: Amber Villarreal, female    DOB: 03-Sep-1975, 36 y.o.   MRN: [id number]  HPI  Here for evaluation of many symptoms  Main symptoms related to abdominal pain and diarrhea: abdominal pain and nausea and diarrhea.  Began about 6 months ago.  She feels like it all started after she ate food at a woman's house- chitlins.  She feels she may have been intentionally poisoned.  States the food "tasted like it came out of the toilet"  Nausea, then with vomiting about a week later.  First two weeks has severe diarrhea and emesis.  Since then, has loose stools three days a week.  Described as "green, runny like grease, black, oily.  Reports this may happen 3-4 episodes per day.  Continued nausea, every few days, last emesis 2 days ago, described as "more than a spoonful of bright red blood"  Reports a solid stool "every once in a while"  Has researched her symptoms on the Internet, feels she may have a parasite.  Also reports abdominal pain after she eats, later would have diarrhea.  Notes fever and cold chills "not extreme, but out of the blue"  Abdominal pains sometimes wakes her up from sleep- worst at night, 1 hour after she eats.  Children in household have not been affected.  Notes drinks 1-2 shots or 1 beer a few weekends a month, does not cause diarrhea or abdominal pain.  Other symptoms include Laughing and going to pass out. Sometimes gets so weak she can only lay in her bed.  Chest pain has been improving, was evaluated by urgent care for this,  Has a follow-up appointment January 13th for a cardiologist.  Wants to resume depo.  States she felt her body needed a break.  Reports no stress, moved into a new house.  Looking forward to christmas.  Does not work.  Sometimes feels down.    Upon finishing the exam, asks to speak with me.  Reports not feeling her symptoms are taken seriously today.  She disagrees with the thought that symptoms may be related or exacerbated by  mental illness.  States her anxiety and worry are due to fear of dying and leaving her children due to her serious underlying conditions that have yet to be diagnosed.  Does not report SI/HI.  At the end of the visit, she inquires if I think Risperdal would help her.   Review of Systemssee HPI    Objective:   Physical Exam GEN: Alert & Oriented, No acute distress CV:  Regular Rate & Rhythm, no murmur Respiratory:  Normal work of breathing, CTAB Abd:  + BS, soft, no tenderness to palpation Ext: no pre-tibial edema Rectal:  Normal externally with no hemorrhoids.  Good rectal tone.  Hemoccult positive Psych;  Inconsistent history, pressured speech, tearful at times.      Assessment & Plan:

## 2012-06-12 NOTE — Assessment & Plan Note (Signed)
Chronic diarrhea and abdominal pain with ? hematemesis and rectal bleeding per history with hemoccult positive stool.  History of Eating chitterlings, but as ws 6 months ago, less likely cause of continued symptoms.  Reports fatty stools as well.  Will check CMET, CBC, TSH, and stool fecal fat, stool culture, O & P.  Will discuss results with patients when they return, will plan on referral to GI to further evaluation of chronic diarrhea, hematemesis, and rectal bleeding.

## 2012-06-16 ENCOUNTER — Encounter: Payer: Self-pay | Admitting: Family Medicine

## 2012-07-03 ENCOUNTER — Encounter: Payer: Self-pay | Admitting: *Deleted

## 2012-07-07 ENCOUNTER — Encounter: Payer: Self-pay | Admitting: Cardiology

## 2012-07-07 ENCOUNTER — Ambulatory Visit (INDEPENDENT_AMBULATORY_CARE_PROVIDER_SITE_OTHER): Payer: Medicaid Other | Admitting: Cardiology

## 2012-07-07 VITALS — BP 118/80 | HR 87 | Ht 70.0 in | Wt 208.0 lb

## 2012-07-07 DIAGNOSIS — R079 Chest pain, unspecified: Secondary | ICD-10-CM

## 2012-07-07 LAB — LIPID PANEL
Cholesterol: 194 mg/dL (ref 0–200)
LDL Cholesterol: 112 mg/dL — ABNORMAL HIGH (ref 0–99)
Triglycerides: 164 mg/dL — ABNORMAL HIGH (ref 0.0–149.0)
VLDL: 32.8 mg/dL (ref 0.0–40.0)

## 2012-07-07 NOTE — Patient Instructions (Addendum)
Your physician recommends that you have a FASTING lipid profile today.  Your physician has requested that you have an echocardiogram. Echocardiography is a painless test that uses sound waves to create images of your heart. It provides your doctor with information about the size and shape of your heart and how well your heart's chambers and valves are working. This procedure takes approximately one hour. There are no restrictions for this procedure.  Your physician has requested that you have an exercise tolerance test. For further information please visit [email or web address] Please also follow instruction sheet, as given. Can be scheduled with PA/NP.  Your physician recommends that you schedule a follow-up appointment as needed with Dr Shirlee Latch.

## 2012-07-07 NOTE — Progress Notes (Signed)
Patient ID: Amber Villarreal, female   DOB: Feb 04, 1976, 37 y.o.   MRN: [id number] PCP: Dr. Earnest Bailey  37 yo presents for evaluation of chest pain.  She has a family history of premature CAD is concerned that this could be coming from her heart.  Mother had CABG in her 36s and aunt had sudden cardiac death in her 36s.  Patient reports chest pain episodes for a year.  She relates the chest pain to OCP use: more chest pain when she is using OCPs, better when she is off them.  She notes the pain typically when she is lying in bed at night.  It will be a central chest tightness and last for a few minutes. She will take an aspirin and the pain will resolve.  She has no exertional chest pain and the chest pain is not related to meals.  She does get short of breath when she walks up a hill.  No tachypalpitations or syncope.  She does not get much exercise.    ECG: NSR, normal  Labs (12/13): TSH normal, HCT 39.9, K 4.2, creatinine 0.82  PMH: 1. Bipolar disorder 2. Chronic abdominal pain/diarrhea 3. Chest pain 4. Headaches  FH: Aunt with sudden cardiac death in her 64s.  Mother with CABG in her 67s.   SH: ETOH use with binge drinking on weekends.  No smoking x 4 years.  Unemployed with 4 children.   ROS: All systems reviewed and negative except as per HPI.   Current Outpatient Prescriptions  Medication Sig Dispense Refill  . aspirin 325 MG tablet Take 325-650 mg by mouth daily as needed. When heart hurts      . fish oil-omega-3 fatty acids 1000 MG capsule Take 2 g by mouth daily.      . Multiple Vitamins-Minerals (WOMENS DAILY FORMULA PO) Take by mouth as needed.       Current Facility-Administered Medications  Medication Dose Route Frequency Provider Last Rate Last Dose  . medroxyPROGESTERone (DEPO-PROVERA) injection 150 mg  150 mg Intramuscular Once Sanjuana Letters, MD        BP 118/80  Pulse 87  Ht 5\' 10"  (1.778 m)  Wt 208 lb (94.348 kg)  BMI 29.84 kg/m2 General: NAD Neck: No JVD, no  thyromegaly or thyroid nodule.  Lungs: Clear to auscultation bilaterally with normal respiratory effort. CV: Nondisplaced PMI.  Heart regular S1/S2, no S3/S4, no murmur.  No peripheral edema.  No carotid bruit.  Normal pedal pulses.  Abdomen: Soft, nontender, no hepatosplenomegaly, no distention.  Skin: Intact without lesions or rashes.  Neurologic: Alert and oriented x 3.  Psych: Normal affect. Extremities: No clubbing or cyanosis.  HEENT: Normal.   Assessment/Plan: 37 yo with history of atypical chest pain presents for cardiology evaluation.  She does not have exertional chest pain.  I do not think that the pain is ischemic.  It is possible that the pain is related to GERD as it happens mostly when she is lying down at night.  However, she is very worried about it because her mother and aunt both had symptomatic CAD in their 26s.  She wants to exercise more but is scared because she is concerned she is going to have a heart attack.  - I will have her do an ETT.  If this is normal, she is cleared to exercise more.  - I will get an echocardiogram. - Given family history of premature CAD, I will get her lipids.    Marca Ancona 07/07/2012

## 2012-07-14 ENCOUNTER — Ambulatory Visit (HOSPITAL_COMMUNITY): Payer: Medicaid Other | Attending: Cardiology

## 2012-07-14 DIAGNOSIS — R072 Precordial pain: Secondary | ICD-10-CM

## 2012-07-14 DIAGNOSIS — R079 Chest pain, unspecified: Secondary | ICD-10-CM | POA: Insufficient documentation

## 2012-07-14 NOTE — Progress Notes (Signed)
Echocardiogram performed.  

## 2012-07-22 ENCOUNTER — Ambulatory Visit (INDEPENDENT_AMBULATORY_CARE_PROVIDER_SITE_OTHER): Payer: Medicaid Other | Admitting: Physician Assistant

## 2012-07-22 DIAGNOSIS — R9439 Abnormal result of other cardiovascular function study: Secondary | ICD-10-CM

## 2012-07-22 DIAGNOSIS — R079 Chest pain, unspecified: Secondary | ICD-10-CM

## 2012-07-22 NOTE — Progress Notes (Signed)
Exercise Treadmill Test  Pre-Exercise Testing Evaluation Rhythm: normal sinus  Rate: 86                 Test  Exercise Tolerance Test Ordering MD: Marca Ancona, MD  Interpreting MD: Tereso Newcomer, PA-C  Unique Test No: 1  Treadmill:  1  Indication for ETT: chest pain - rule out ischemia  Contraindication to ETT: No   Stress Modality: exercise - treadmill  Cardiac Imaging Performed: non   Protocol: standard Bruce - maximal  Max BP:  163/85  Max MPHR (bpm):  182 85% MPR (bpm):  155  MPHR obtained (bpm):  166 % MPHR obtained:  91%  Reached 85% MPHR (min:sec):  4:23 Total Exercise Time (min-sec):  5:01  Workload in METS:  7.0 Borg Scale: 16  Reason ETT Terminated:  fatigue    ST Segment Analysis At Rest: normal ST segments - no evidence of significant ST depression With Exercise: borderline ST changes  Other Information Arrhythmia:  No Angina during ETT:  present (1) Quality of ETT:  non-diagnostic  ETT Interpretation:  borderline (indeterminate) with non-specific ST changes  Comments: Poor exercise tolerance. Patient did complain of chest pain. Normal BP response to exercise. Test interpretation limited by significant amounts of motion artifact.  There appears to be borderline changes.   Recommendations: With significant family hx and poor exercise tolerance with difficult to interpret ETT and potential borderline changes, will schedule Lexiscan Myoview.  Luna Glasgow, PA-C  3:47 PM 07/22/2012

## 2012-07-30 ENCOUNTER — Ambulatory Visit (HOSPITAL_COMMUNITY): Payer: Medicaid Other | Attending: Cardiology | Admitting: Radiology

## 2012-07-30 VITALS — BP 115/85 | Ht 70.0 in | Wt 207.0 lb

## 2012-07-30 DIAGNOSIS — R0989 Other specified symptoms and signs involving the circulatory and respiratory systems: Secondary | ICD-10-CM | POA: Insufficient documentation

## 2012-07-30 DIAGNOSIS — R42 Dizziness and giddiness: Secondary | ICD-10-CM | POA: Insufficient documentation

## 2012-07-30 DIAGNOSIS — R0789 Other chest pain: Secondary | ICD-10-CM | POA: Insufficient documentation

## 2012-07-30 DIAGNOSIS — R0602 Shortness of breath: Secondary | ICD-10-CM

## 2012-07-30 DIAGNOSIS — R079 Chest pain, unspecified: Secondary | ICD-10-CM

## 2012-07-30 DIAGNOSIS — R9439 Abnormal result of other cardiovascular function study: Secondary | ICD-10-CM

## 2012-07-30 DIAGNOSIS — R002 Palpitations: Secondary | ICD-10-CM | POA: Insufficient documentation

## 2012-07-30 DIAGNOSIS — R0609 Other forms of dyspnea: Secondary | ICD-10-CM | POA: Insufficient documentation

## 2012-07-30 MED ORDER — TECHNETIUM TC 99M SESTAMIBI GENERIC - CARDIOLITE
30.0000 | Freq: Once | INTRAVENOUS | Status: AC | PRN
  Administered 2012-07-30: 30 via INTRAVENOUS

## 2012-07-30 MED ORDER — TECHNETIUM TC 99M SESTAMIBI GENERIC - CARDIOLITE
10.0000 | Freq: Once | INTRAVENOUS | Status: AC | PRN
  Administered 2012-07-30: 10 via INTRAVENOUS

## 2012-07-30 MED ORDER — REGADENOSON 0.4 MG/5ML IV SOLN
0.4000 mg | Freq: Once | INTRAVENOUS | Status: AC
  Administered 2012-07-30: 0.4 mg via INTRAVENOUS

## 2012-07-30 MED ORDER — AMINOPHYLLINE 25 MG/ML IV SOLN
75.0000 mg | Freq: Once | INTRAVENOUS | Status: AC
  Administered 2012-07-30: 75 mg via INTRAVENOUS

## 2012-07-30 NOTE — Progress Notes (Addendum)
[facility] SITE 3 NUCLEAR MED [address] [place], [place] [address] [phone]    Cardiology Nuclear Med Study  Amber Villarreal is a 37 y.o. female     MRN : [id number]     DOB: 1975-10-11  Procedure Date: 07/30/2012  Nuclear Med Background Indication for Stress Test:  Evaluation for Ischemia, and 07-22-12 Abnormal GXT History:  07/14/12 ECHO: EF: 55-60%, 07/22/12 GXT: Borderline/Indeterminate with Non Specific ST,T changes Cardiac Risk Factors: Family History - CAD and History of Smoking  Symptoms:  Chest Pain, Chest Tightness, Diaphoresis, DOE, Light-Headedness, Nausea, Palpitations and SOB   Nuclear Pre-Procedure Caffeine/Decaff Intake:  None > 12 hrs NPO After: 2:00am   Lungs:  clear O2 Sat: 99% on room air. IV 0.9% NS with Angio Cath:  22g  IV Site: R Antecubital x 1, tolerated well IV Started by:  Irean Hong, RN  Chest Size (in):  40 Cup Size: DD  Height: 5\' 10"  (1.778 m)  Weight:  207 lb (93.895 kg)  BMI:  Body mass index is 29.70 kg/(m^2). Tech Comments:  No medications today    Nuclear Med Study 1 or 2 day study: 1 day  Stress Test Type:  Lexiscan  Reading MD: Amber Millers, MD  Order Authorizing Provider:  Marca Ancona, MD, and Tereso Newcomer, [facility]  Resting Radionuclide: Technetium 29m Sestamibi  Resting Radionuclide Dose: 11.0 mCi   Stress Radionuclide:  Technetium 17m Sestamibi  Stress Radionuclide Dose: 32.8 mCi           Stress Protocol Rest HR: 75 Stress HR: 133  Rest BP: 115/85 Stress BP: 134/91  Exercise Time (min): n/a METS: n/a   Predicted Max HR: 182 bpm % Max HR: 69.78 bpm Rate Pressure Product: 21308    Dose of Adenosine (mg):  n/a Dose of Lexiscan: 0.4 mg  Dose of Atropine (mg): n/a Dose of Dobutamine: n/a mcg/kg/min (at max HR)  Stress Test Technologist: Milana Na, EMT-P  Nuclear Technologist:  Domenic Polite, CNMT     Rest Procedure:  Myocardial perfusion imaging was performed at rest 45 minutes following the  intravenous administration of Technetium 47m Sestamibi. Rest ECG: NSR - Normal EKG  Stress Procedure:  The patient received IV Lexiscan 0.4 mg over 15-seconds.  Technetium 12m Sestamibi injected at 30-seconds. This patient was very symptomatic with Lexiscan. She had sob, chest burning, leg weakness, pain all over, and felt like she was going to die. This patient's symptoms were reversed with Aminophylline 75 mg IV. Quantitative spect images were obtained after a 45 minute delay. Stress ECG: No significant ST segment change suggestive of ischemia.  QPS Raw Data Images:  There is interference from nuclear activity from structures below the diaphragm. This does not affect the ability to read the study. Stress Images:  There is decreased uptake in the anterior wall and apex. Rest Images:  There is decreased uptake in the anterior wall and apex, slightly less prominent compared to the stress images. Subtraction (SDS):  Probable shifting breast attenuation but no evidence of ischemia. Transient Ischemic Dilatation (Normal <1.22):  1.01 Lung/Heart Ratio (Normal <0.45):  0.29  Quantitative Gated Spect Images QGS EDV:  84 ml QGS ESV:  42 ml  Impression Exercise Capacity:  Lexiscan with no exercise. BP Response:  Normal blood pressure response. Clinical Symptoms:  There is dyspnea. ECG Impression:  No significant ST segment change suggestive of ischemia. Comparison with Prior Nuclear Study: No images to compare  Overall Impression:  Probably normal stress nuclear study  with a small, medium intensity, predominantly fixed anterior and apical defect suggestive of soft tissue attenuation but no ischemia (minimal reversibility most likely related to shifting breast attenuation).  Possible small nodule left breast; suggest mammogram to further evaluate.  LV Ejection Fraction: 50%.  LV Wall Motion:  NL LV Function; NL Wall Motion   Amber Villarreal

## 2012-08-01 ENCOUNTER — Telehealth: Payer: Self-pay | Admitting: *Deleted

## 2012-08-01 ENCOUNTER — Encounter: Payer: Self-pay | Admitting: Physician Assistant

## 2012-08-01 NOTE — Telephone Encounter (Signed)
pt notified about myoview results and to f/u PCP for mammogram about possible left breast nodule seen on myoview, pt verbalized understanding and said thank you

## 2012-08-01 NOTE — Telephone Encounter (Signed)
Message copied by Tarri Fuller on Fri Aug 01, 2012  5:29 PM ------      Message from: [place], [place] T      Created: Fri Aug 01, 2012  2:52 PM       Please inform patient stress test normal.      ##However, images demonstrate a possible small left breast nodule.##      She needs referral to her PCP to arrange mammogram.  Send copy of stress nuclear report to PCP.      Tereso Newcomer, PA-C  2:51 PM 08/01/2012

## 2012-08-04 ENCOUNTER — Encounter: Payer: Self-pay | Admitting: Family Medicine

## 2012-08-04 DIAGNOSIS — N63 Unspecified lump in unspecified breast: Secondary | ICD-10-CM | POA: Insufficient documentation

## 2012-08-06 ENCOUNTER — Ambulatory Visit: Payer: Medicaid Other | Admitting: Family Medicine

## 2012-08-11 ENCOUNTER — Ambulatory Visit (INDEPENDENT_AMBULATORY_CARE_PROVIDER_SITE_OTHER): Payer: Medicaid Other | Admitting: Family Medicine

## 2012-08-11 ENCOUNTER — Encounter: Payer: Self-pay | Admitting: Family Medicine

## 2012-08-11 VITALS — BP 138/88 | HR 92 | Temp 98.2°F | Ht 70.0 in | Wt 210.0 lb

## 2012-08-11 DIAGNOSIS — N63 Unspecified lump in unspecified breast: Secondary | ICD-10-CM

## 2012-08-11 DIAGNOSIS — R928 Other abnormal and inconclusive findings on diagnostic imaging of breast: Secondary | ICD-10-CM

## 2012-08-12 NOTE — Progress Notes (Signed)
  Subjective:    Patient ID: Amber Villarreal, female    DOB: April 17, 1976, 37 y.o.   MRN: [id number]  HPI # [id number] for referral for mammogram. Recent myoview showed possible small left breast nodule.  Patient is asymptomatic from left breat nodule, which she does not feel.   Review of Systems She denies nipple discharge, fevers/chills, breast tenderness.   Allergies, medication, past medical history reviewed.  Smoking status noted.  Bipolar disorder Obesity Chest pain being evaluated by cardiology--recent Myoview normal  Family history: no breast cancer; colon cancer in uncle, unsure of age, maybe 34s     Objective:   Physical Exam GEN: NAD; obese BREAST: no nodules palpable; no discharge from nipples; no axillary LAD    Assessment & Plan:

## 2012-08-13 NOTE — Assessment & Plan Note (Signed)
Potential left breast nodule found on Myoview study. Diagnostic mammogram and ultrasound of left breast ordered.

## 2012-08-20 ENCOUNTER — Other Ambulatory Visit: Payer: Self-pay | Admitting: Family Medicine

## 2012-08-20 ENCOUNTER — Ambulatory Visit
Admission: RE | Admit: 2012-08-20 | Discharge: 2012-08-20 | Disposition: A | Discharge status: Home / Self Care | Payer: Medicaid Other | Source: Ambulatory Visit | Attending: Family Medicine | Admitting: Family Medicine

## 2012-08-20 DIAGNOSIS — N63 Unspecified lump in unspecified breast: Secondary | ICD-10-CM

## 2012-09-16 ENCOUNTER — Ambulatory Visit: Payer: Medicaid Other | Admitting: Family Medicine

## 2012-09-18 ENCOUNTER — Ambulatory Visit (INDEPENDENT_AMBULATORY_CARE_PROVIDER_SITE_OTHER): Payer: Medicaid Other | Admitting: Family Medicine

## 2012-09-18 ENCOUNTER — Other Ambulatory Visit (HOSPITAL_COMMUNITY)
Admission: RE | Admit: 2012-09-18 | Discharge: 2012-09-18 | Disposition: A | Discharge status: Home / Self Care | Payer: Medicaid Other | Source: Ambulatory Visit | Attending: Family Medicine | Admitting: Family Medicine

## 2012-09-18 DIAGNOSIS — N760 Acute vaginitis: Secondary | ICD-10-CM

## 2012-09-18 DIAGNOSIS — Z7251 High risk heterosexual behavior: Secondary | ICD-10-CM

## 2012-09-18 DIAGNOSIS — Z113 Encounter for screening for infections with a predominantly sexual mode of transmission: Secondary | ICD-10-CM | POA: Insufficient documentation

## 2012-09-18 DIAGNOSIS — Z309 Encounter for contraceptive management, unspecified: Secondary | ICD-10-CM

## 2012-09-18 DIAGNOSIS — R21 Rash and other nonspecific skin eruption: Secondary | ICD-10-CM

## 2012-09-18 LAB — CBC
HCT: 39 % (ref 36.0–46.0)
MCH: 31.9 pg (ref 26.0–34.0)
MCV: 91.5 fL (ref 78.0–100.0)
Platelets: 344 10*3/uL (ref 150–400)
RBC: 4.26 MIL/uL (ref 3.87–5.11)
WBC: 9.4 10*3/uL (ref 4.0–10.5)

## 2012-09-18 LAB — POCT WET PREP (WET MOUNT): Clue Cells Wet Prep Whiff POC: NEGATIVE

## 2012-09-18 MED ORDER — TRIAMCINOLONE ACETONIDE 0.5 % EX OINT: TOPICAL_OINTMENT | Freq: Two times a day (BID) | CUTANEOUS | Status: DC

## 2012-09-18 NOTE — Patient Instructions (Signed)
It was nice to meet you.  We will call you with your lab results.   Please try the steroid cream twice daily on your rash.    Please make a follow up appointment with Dr. Earnest Bailey if this is not getting better.

## 2012-09-19 ENCOUNTER — Telehealth: Payer: Self-pay | Admitting: *Deleted

## 2012-09-19 DIAGNOSIS — Z7251 High risk heterosexual behavior: Secondary | ICD-10-CM | POA: Insufficient documentation

## 2012-09-19 DIAGNOSIS — R21 Rash and other nonspecific skin eruption: Secondary | ICD-10-CM | POA: Insufficient documentation

## 2012-09-19 MED ORDER — MEDROXYPROGESTERONE ACETATE 150 MG/ML IM SUSP
150.0000 mg | Freq: Once | INTRAMUSCULAR | Status: AC
  Administered 2012-09-18: 150 mg via INTRAMUSCULAR

## 2012-09-19 MED ORDER — MEDROXYPROGESTERONE ACETATE 150 MG/ML IM SUSP: 150.0000 mg | Freq: Once | INTRAMUSCULAR | Status: DC

## 2012-09-19 NOTE — Addendum Note (Signed)
Addended by: Altamese Dilling A on: 09/19/2012 04:23 PM   Modules accepted: Orders

## 2012-09-19 NOTE — Assessment & Plan Note (Signed)
Wet prep, GC/Chlamdydia done today, HIV/RPR drawn.  Advised to practice safe sex.

## 2012-09-19 NOTE — Progress Notes (Signed)
  Subjective:    Patient ID: Amber Villarreal, female    DOB: December 27, 1975, 37 y.o.   MRN: [id number]  HPI:  Amber Villarreal comes in for a same day appointment and has several complaints.   Rash: She says about a month ago she had a bad rash after she let some girl use her Deoderant.  It started in her arm pits and spread to her arms.  She says it was very red and bumpy. She has been putting bleach on the dark spots but they are not getting any better. She says she thinks it is a fungus.  She then says her son had "wig worm" and they had to give him antibiotics for it.  She says her son sleeps with her in the bed and does not have her rash but she is worried about him getting it.  After telling me extensively about he rash and me offering my opinion, she tells me that Dr. Earnest Bailey did the same thing and blew her off.  She says I do not know her body, she asked me how old I was and if I knew what I was talking about.   She also says she has had unprotected sexual intercourse with a non-monogomous partner and is worried she could have an infection from that that is causing the rash on her arm pits/inner arms.   Past Medical History  Diagnosis Date  . Bipolar disorder, unspecified   . Opioid type dependence, unspecified   . Anemia     history of  . Hx of cardiovascular stress test     a. Lex MV 2/14: EF 50%, no ischemia, small L breast nodule    History  Substance Use Topics  . Smoking status: Former Games developer  . Smokeless tobacco: Not on file  . Alcohol Use: 1.8 oz/week    3 Cans of beer per week     Comment: Occasional    Family History  Problem Relation Age of Onset  . Heart disease Mother 50    bypass  . Diabetes Mother   . Hypertension Mother   . Cancer Mother     vaginal cancer  . Hypertension Sister   . Stroke Neg Hx   . Coronary artery disease    . Congestive Heart Failure Mother     early 55's     ROS Pertinent items in HPI    Objective:  Physical Exam:  There were no vitals  taken for this visit. General appearance: alert and no distress Head: Normocephalic, without obvious abnormality, atraumatic Skin: there are several keloids on inner arms, with hyperpigmentation in inner arms and axilla.  No sign of tinea, other fungal rash.  Pelvic: Normal external genitalia, scan white discharge with small amount of blood in vaginal vault.  No tenderness or masses.        Assessment & Plan:

## 2012-09-19 NOTE — Addendum Note (Signed)
Addended by: Altamese Dilling A on: 09/19/2012 12:59 PM   Modules accepted: Orders

## 2012-09-19 NOTE — Assessment & Plan Note (Signed)
Patient has keloids that appear to be scarring with some irritated skin.  Advised not to put bleach on skin.  Advised to try steroid ointment.  I am slightly concerned that the hyperpigmentation may be related to impaired fasting glucose, but she disagrees with me.  I will check a CBC to be sure she has no abnormalities as she is so insistent something serious is wrong.

## 2012-09-24 ENCOUNTER — Encounter: Payer: Self-pay | Admitting: Family Medicine

## 2012-10-27 ENCOUNTER — Ambulatory Visit (INDEPENDENT_AMBULATORY_CARE_PROVIDER_SITE_OTHER): Payer: Medicaid Other | Admitting: Family Medicine

## 2012-10-27 VITALS — BP 128/80 | HR 101 | Temp 98.6°F | Ht 70.0 in | Wt 209.0 lb

## 2012-10-27 DIAGNOSIS — R21 Rash and other nonspecific skin eruption: Secondary | ICD-10-CM

## 2012-10-27 MED ORDER — CLINDAMYCIN PHOS-BENZOYL PEROX 1-5 % EX GEL: Freq: Two times a day (BID) | CUTANEOUS | Status: DC

## 2012-10-27 NOTE — Progress Notes (Signed)
Subjective:     Patient ID: Amber Villarreal, female   DOB: 01/25/1976, 37 y.o.   MRN: [id number]  HPI 25 yu viist to discuss to F presents for the following:  1. Rash: x 6 months. Waxing and waning vesicular rash on chest and back. Worsened with topical steroid. Pruritic. Currently no treatment.  2. Rash: L flank improved with steroid. R upper arm. No treatment. Hyperpigmented. Flat. Scaly.   Review of Systems As per HPI     Objective:   Physical Exam BP 128/80  Pulse 101  Temp(Src) 98.6 F (37 C) (Oral)  Ht 5\' 10"  (1.778 m)  Wt 209 lb (94.802 kg)  BMI 29.99 kg/m2 General appearance: alert, cooperative and no distress Skin:  Hyperpigmented papules with scattered pustules on chest and mid back. Flat, xerotic hyperpigmented papules on R upper arm.      Assessment and Plan:

## 2012-10-27 NOTE — Assessment & Plan Note (Addendum)
A: worsened with topical steroid. Pustules on chest nad back concerning for acne. Xerotic papules on chest concerning for eczema. P: Clindamycin gel for chest and back. Steroid ointment for arm.

## 2012-10-27 NOTE — Patient Instructions (Addendum)
Amber Villarreal,  Thank you for coming in today.  Please start clindamcyin gel twice daily.  Follow up with me in 3-4 weeks. Come back sooner if blisters come back.   Dr. Armen Pickup

## 2012-11-21 ENCOUNTER — Ambulatory Visit (INDEPENDENT_AMBULATORY_CARE_PROVIDER_SITE_OTHER): Payer: Medicaid Other | Admitting: Family Medicine

## 2012-11-21 ENCOUNTER — Encounter: Payer: Self-pay | Admitting: Family Medicine

## 2012-11-21 VITALS — BP 133/96 | HR 104 | Temp 98.2°F | Wt 210.0 lb

## 2012-11-21 DIAGNOSIS — R079 Chest pain, unspecified: Secondary | ICD-10-CM

## 2012-11-21 DIAGNOSIS — R7301 Impaired fasting glucose: Secondary | ICD-10-CM

## 2012-11-21 DIAGNOSIS — R21 Rash and other nonspecific skin eruption: Secondary | ICD-10-CM

## 2012-11-21 MED ORDER — TERBINAFINE HCL 1 % EX CREA: TOPICAL_CREAM | Freq: Two times a day (BID) | CUTANEOUS | Status: DC

## 2012-11-21 NOTE — Assessment & Plan Note (Signed)
A: Improved. The lesion on her right upper lateral thigh appears to be eczema versus mild tinea.  P:  Continue BenzaClin. Prescribed Lamisil to use when necessary. Followup as needed.

## 2012-11-21 NOTE — Progress Notes (Addendum)
Subjective:     Patient ID: Amber Villarreal, female   DOB: Oct 13, 1975, 37 y.o.   MRN: [id number]  HPI 37 year female and treated bipolar disorder presents for followup to discuss the following:  1. skin rash: Patient with hyperpigmented papules on her chest and upper extremity is. She started BenzaClin after her last office visit. She is very pleased with the results. She feels that the hyperpigmented many papules on her chest have light in significantly. She does point out areas on her upper arms and on her left lateral thigh and also hyperpigmented she has occasional pruritus. Usually at night. There are no household members with itching or rash.  2. Headache: Patient with left occipital headaches off and on for the past 10 months. She doesn't have headaches have returned and have been worse over the past 2 months ago. This occurred about 2 times a week. Describe as tension as constant with intermittent sharp stabbing sensation. Associated with some blurred vision. No focal weakness. No nausea. She's not had anything for the pain.  3. chest pain: Chronic problem. She hasn't chest pain sometimes associated with the headache. The pain is nonexertional, substernal and nonradiating. She does not smoke she has smoked in the past and quit 4 years ago. She has a family history coronary artery disease. She does not have documented diabetes are persistent hypertension.  Review of Systems As per history of present illness    Objective:   Physical Exam BP 133/96  Pulse 104  Temp(Src) 98.2 F (36.8 C) (Oral)  Wt 210 lb (95.255 kg)  BMI 30.13 kg/m2 General appearance: alert, cooperative and no distress Eyes: conjunctivae/corneas clear. PERRL, EOM's intact.  Skin: Hyperpigmented papules chest. Right arm slightly erythematous. She does have a minute papule on her right upper lateral thigh but somewhat scaly.  Neurologic: Alert and oriented X 3, normal strength and tone. Normal symmetric reflexes. Normal  coordination and gait    Assessment and Plan:

## 2012-11-21 NOTE — Patient Instructions (Addendum)
Santresa,  Thank you for coming in today. Please continue benzaclin Try lamisil, topical antifungal on thigh rash.   Schedule a physical with me to address wellness and other concerns.  I suspect you may have migraines but I need more time to assess this.   Dr. Armen Pickup

## 2012-11-21 NOTE — Assessment & Plan Note (Addendum)
A: Recent negative myoview. Concerned about her pacing preserved relative to her untreated bipolar disorder. Have discussed this briefly with the patient and she does not believe this is the case. P: Checked A1c to make sure she does not have untreated diabetes and her A1c was normal. Have asked patient to increase vegetables in her diet. We'll have her followup for physical.

## 2012-11-27 ENCOUNTER — Ambulatory Visit: Payer: Medicaid Other

## 2012-12-03 ENCOUNTER — Ambulatory Visit (INDEPENDENT_AMBULATORY_CARE_PROVIDER_SITE_OTHER): Payer: Medicaid Other | Admitting: *Deleted

## 2012-12-03 DIAGNOSIS — IMO0001 Reserved for inherently not codable concepts without codable children: Secondary | ICD-10-CM

## 2012-12-03 DIAGNOSIS — Z309 Encounter for contraceptive management, unspecified: Secondary | ICD-10-CM

## 2012-12-03 MED ORDER — MEDROXYPROGESTERONE ACETATE 150 MG/ML IM SUSP
150.0000 mg | Freq: Once | INTRAMUSCULAR | Status: AC
  Administered 2012-12-03: 150 mg via INTRAMUSCULAR

## 2012-12-03 NOTE — Progress Notes (Signed)
Pt here for depo. Depo given LUOQ. Next depo due aug 28-sept 7 - reminder card given. Wyatt Haste, RN-BSN

## 2013-01-02 ENCOUNTER — Telehealth: Payer: Self-pay | Admitting: Family Medicine

## 2013-01-02 DIAGNOSIS — R21 Rash and other nonspecific skin eruption: Secondary | ICD-10-CM

## 2013-01-02 MED ORDER — CLINDAMYCIN PHOSPHATE 1 % EX SOLN: Freq: Two times a day (BID) | CUTANEOUS | Status: DC

## 2013-01-02 NOTE — Telephone Encounter (Signed)
benzaclin on back order per patients' [facility].  Clindamycin phosphate solution ordered to replace benzaclin.

## 2013-03-04 ENCOUNTER — Ambulatory Visit (INDEPENDENT_AMBULATORY_CARE_PROVIDER_SITE_OTHER): Payer: Medicaid Other | Admitting: *Deleted

## 2013-03-04 DIAGNOSIS — Z309 Encounter for contraceptive management, unspecified: Secondary | ICD-10-CM

## 2013-03-04 MED ORDER — MEDROXYPROGESTERONE ACETATE 150 MG/ML IM SUSP
150.0000 mg | Freq: Once | INTRAMUSCULAR | Status: AC
  Administered 2013-03-04: 150 mg via INTRAMUSCULAR

## 2013-03-04 NOTE — Progress Notes (Signed)
Patient here today for Depo Provera.  Upreg ordered based on date in last nurse visit states due date is Aug. 28-Sept. 7.  Patient states that card has due date of Aug. 28-Sept. 10.  Last Depo was received on 12/03/12 and is due Aug. 28-Sept. 10.  Upreg canceled and patient given Depo Provera today.  Reminder card given for next Depo on Nov. 26-Dec. 10.  Gaylene Brooks, RN

## 2013-04-16 ENCOUNTER — Encounter: Payer: Self-pay | Admitting: Emergency Medicine

## 2013-04-16 ENCOUNTER — Ambulatory Visit (INDEPENDENT_AMBULATORY_CARE_PROVIDER_SITE_OTHER): Payer: Medicaid Other | Admitting: Emergency Medicine

## 2013-04-16 ENCOUNTER — Ambulatory Visit (HOSPITAL_COMMUNITY)
Admission: RE | Admit: 2013-04-16 | Discharge: 2013-04-16 | Disposition: A | Discharge status: Home / Self Care | Payer: Medicaid Other | Source: Ambulatory Visit | Attending: Family Medicine | Admitting: Family Medicine

## 2013-04-16 VITALS — BP 120/84 | HR 93 | Temp 98.3°F | Wt 213.0 lb

## 2013-04-16 DIAGNOSIS — R0602 Shortness of breath: Secondary | ICD-10-CM | POA: Insufficient documentation

## 2013-04-16 DIAGNOSIS — R05 Cough: Secondary | ICD-10-CM | POA: Insufficient documentation

## 2013-04-16 DIAGNOSIS — R059 Cough, unspecified: Secondary | ICD-10-CM | POA: Insufficient documentation

## 2013-04-16 DIAGNOSIS — L509 Urticaria, unspecified: Secondary | ICD-10-CM

## 2013-04-16 LAB — CBC
MCV: 93.9 fL (ref 78.0–100.0)
Platelets: 385 10*3/uL (ref 150–400)
RDW: 13.3 % (ref 11.5–15.5)
WBC: 7.5 10*3/uL (ref 4.0–10.5)

## 2013-04-16 LAB — COMPREHENSIVE METABOLIC PANEL
ALT: 28 U/L (ref 0–35)
AST: 18 U/L (ref 0–37)
Calcium: 9.7 mg/dL (ref 8.4–10.5)
Chloride: 108 mEq/L (ref 96–112)
Creat: 0.85 mg/dL (ref 0.50–1.10)
Potassium: 4.1 mEq/L (ref 3.5–5.3)

## 2013-04-16 MED ORDER — PREDNISONE 50 MG PO TABS: 50.0000 mg | ORAL_TABLET | Freq: Every day | ORAL | Status: DC

## 2013-04-16 NOTE — Assessment & Plan Note (Signed)
Likely post-nasal drip vs viral URI.   No fevers or exam findings to suggest PNA. Will check CXR (for cough and evaluate for sarcoidosis given skin findings). Follow up in 1-2 weeks.

## 2013-04-16 NOTE — Patient Instructions (Signed)
It was nice to meet you!  Continue to use the benadryl as needed for itching. I sent in a prescription for prednisone.  Take 1 pill daily for 5 days. Please avoid alcohol.  We are checking blood work and a CXR.  Someone will call if anything is wrong.  Follow up in 1-2 weeks to discuss the results.

## 2013-04-16 NOTE — Assessment & Plan Note (Addendum)
I suspect idiopathic urticaria. Given the persistence and her anxiety, will check some lab work to evaluate for autoimmune etiology with ESR, CBC, CMP, ANA. Prednisone burst 50mg  daily x5 days given. Continue benadryl prn for itching. Follow up in 1-2 weeks to review results. May need to consider referral to allergist or derm if persistent.   Call [phone] number for any abnormal results.

## 2013-04-16 NOTE — Progress Notes (Signed)
  Subjective:    Patient ID: Amber Villarreal, female    DOB: Mar 20, 1976, 37 y.o.   MRN: [id number]  HPI Amber Villarreal is here for a SDA for itching and cough.  Itching She states that she has had intermittent problems with skin rashes for the last 2-3 years.  She feels that it is getting worse, both in severity and frequency.  She describes developing at itch on her legs, then seeing blisters pop up after she scratches.  She has also had what sounds like urticarial "outbreaks" on her right arm and chest in the past.  Today, she states she had red welts on her thighs and buttock this morning.  They were very itchy.  They resolved with taking some benadryl.  With this episode today, she had associated stiffness in her fingers and "brittle" feeling bones.  She thinks it may get worse after she drinks alcohol, particularly beer.  She is very concerned that something serious is going on.  No family history of SLE, RA or sarcoidosis.  Cough She developed a productive cough about 3 weeks ago, just before she started having worsening itching.  It is productive of thick yellow phlegm.  Denies any nasal symptoms, but describes sensation of post-nasal drip.  No fevers, shortness of breath, wheezing or changes in appetite.  Does feel cold but no rigors.  I have reviewed and updated the following as appropriate: allergies and current medications FHx: no autoimmune diseases SHx: non smoker; does drink alcohol regularly  Review of Systems See HPI    Objective:   Physical Exam BP 120/84  Pulse 93  Temp(Src) 98.3 F (36.8 C) (Oral)  Wt 213 lb (96.616 kg)  BMI 30.56 kg/m2  LMP 04/16/2013 Gen: alert, cooperative, fidgety like she is itchy HEENT: AT/[place], sclera white, MMM Neck: supple Pulm: CTAB, no wheezes or rales Skin: hyperpigmented patches from prior rash on chest; scars on legs from prior blisters; no acute rash seen today      Assessment & Plan:

## 2013-04-22 ENCOUNTER — Encounter: Payer: Self-pay | Admitting: Family Medicine

## 2013-08-12 ENCOUNTER — Telehealth: Payer: Self-pay | Admitting: Family Medicine

## 2013-08-12 ENCOUNTER — Telehealth: Payer: Self-pay | Admitting: *Deleted

## 2013-08-12 MED ORDER — LEVONORGESTREL 0.75 MG PO TABS: 0.7500 mg | ORAL_TABLET | Freq: Two times a day (BID) | ORAL | Status: AC

## 2013-08-12 NOTE — Telephone Encounter (Signed)
Patient came in because she had unprotected sex last night as per nurse, she would like to obtain plan B via her insurance. Prescription e-prescribed to her pharmacy.
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
## 2013-08-13 ENCOUNTER — Other Ambulatory Visit: Payer: Self-pay | Admitting: *Deleted

## 2013-08-13 MED ORDER — CLINDAMYCIN PHOS-BENZOYL PEROX 1-5 % EX GEL: Freq: Two times a day (BID) | CUTANEOUS | Status: DC

## 2013-08-13 NOTE — Telephone Encounter (Signed)
benzaclin rx sent in

## 2013-09-04 ENCOUNTER — Other Ambulatory Visit (HOSPITAL_COMMUNITY)
Admission: RE | Admit: 2013-09-04 | Discharge: 2013-09-04 | Disposition: A | Discharge status: Home / Self Care | Payer: Medicaid Other | Source: Ambulatory Visit | Attending: Family Medicine | Admitting: Family Medicine

## 2013-09-04 ENCOUNTER — Encounter: Payer: Self-pay | Admitting: Family Medicine

## 2013-09-04 ENCOUNTER — Ambulatory Visit (INDEPENDENT_AMBULATORY_CARE_PROVIDER_SITE_OTHER): Payer: Medicaid Other | Admitting: Family Medicine

## 2013-09-04 VITALS — BP 128/80 | HR 102 | Temp 98.2°F | Wt 221.0 lb

## 2013-09-04 DIAGNOSIS — Z309 Encounter for contraceptive management, unspecified: Secondary | ICD-10-CM

## 2013-09-04 DIAGNOSIS — R928 Other abnormal and inconclusive findings on diagnostic imaging of breast: Secondary | ICD-10-CM

## 2013-09-04 DIAGNOSIS — Z113 Encounter for screening for infections with a predominantly sexual mode of transmission: Secondary | ICD-10-CM | POA: Insufficient documentation

## 2013-09-04 DIAGNOSIS — E669 Obesity, unspecified: Secondary | ICD-10-CM

## 2013-09-04 DIAGNOSIS — Z124 Encounter for screening for malignant neoplasm of cervix: Secondary | ICD-10-CM

## 2013-09-04 DIAGNOSIS — N63 Unspecified lump in unspecified breast: Secondary | ICD-10-CM

## 2013-09-04 DIAGNOSIS — Z1151 Encounter for screening for human papillomavirus (HPV): Secondary | ICD-10-CM | POA: Insufficient documentation

## 2013-09-04 DIAGNOSIS — Z202 Contact with and (suspected) exposure to infections with a predominantly sexual mode of transmission: Secondary | ICD-10-CM

## 2013-09-04 DIAGNOSIS — F319 Bipolar disorder, unspecified: Secondary | ICD-10-CM

## 2013-09-04 DIAGNOSIS — Z7251 High risk heterosexual behavior: Secondary | ICD-10-CM

## 2013-09-04 DIAGNOSIS — F1010 Alcohol abuse, uncomplicated: Secondary | ICD-10-CM | POA: Insufficient documentation

## 2013-09-04 DIAGNOSIS — Z01419 Encounter for gynecological examination (general) (routine) without abnormal findings: Secondary | ICD-10-CM | POA: Insufficient documentation

## 2013-09-04 LAB — POCT URINALYSIS DIPSTICK
BILIRUBIN UA: NEGATIVE
GLUCOSE UA: NEGATIVE
KETONES UA: NEGATIVE
Leukocytes, UA: NEGATIVE
NITRITE UA: NEGATIVE
PH UA: 5.5
Protein, UA: NEGATIVE
RBC UA: NEGATIVE
Spec Grav, UA: 1.025
Urobilinogen, UA: 0.2

## 2013-09-04 LAB — POCT WET PREP (WET MOUNT): Clue Cells Wet Prep Whiff POC: POSITIVE

## 2013-09-04 LAB — POCT URINE PREGNANCY: Preg Test, Ur: NEGATIVE

## 2013-09-04 MED ORDER — MEDROXYPROGESTERONE ACETATE 150 MG/ML IM SUSP
150.0000 mg | Freq: Once | INTRAMUSCULAR | Status: AC
  Administered 2013-09-04: 150 mg via INTRAMUSCULAR

## 2013-09-04 NOTE — Assessment & Plan Note (Signed)
A: discussed weigh related to alcohol intake. P: alcohol cessation

## 2013-09-04 NOTE — Patient Instructions (Addendum)
Amber Villarreal,  Thank you for coming in today. Your exam is normal.   I will be in touch with lab results and a treatment plan if needed.  Repeat BP is normal. I agree with replacing alcohol with healthier stress relievers, like exercise (walking outside).   Please get your next mammogram after your 38th birthday.  Recommend next physical in one year. Next pap smear in 3 years if today's is normal.  Dr. Adrian Blackwater
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
## 2013-09-04 NOTE — Assessment & Plan Note (Signed)
A: normal mammogram. P: start screening mammograms at age 38.

## 2013-09-04 NOTE — Assessment & Plan Note (Signed)
A: remains untreated. Likely contributing to agitation and anxious behavior. P:  Advised patient quit alcohol, avoid drugs.

## 2013-09-04 NOTE — Assessment & Plan Note (Signed)
A: normal exam. P: wet prep, HIV, RPR, GC/chlam, hep panel

## 2013-09-04 NOTE — Assessment & Plan Note (Signed)
A: patient admits to drinking more than she would like. Drinking to relieve stress. P: Cessation Replace alcohol with healthy forms of stress relief.

## 2013-09-05 LAB — ACUTE HEP PANEL AND HEP B SURFACE AB
HCV Ab: NEGATIVE
HEP A IGM: NONREACTIVE
HEP B C IGM: NONREACTIVE
HEP B S AB: NEGATIVE
Hepatitis B Surface Ag: NEGATIVE

## 2013-09-05 LAB — RPR

## 2013-09-05 LAB — HIV ANTIBODY (ROUTINE TESTING W REFLEX): HIV: NONREACTIVE

## 2013-09-07 ENCOUNTER — Telehealth: Payer: Self-pay | Admitting: *Deleted

## 2013-09-07 ENCOUNTER — Encounter: Payer: Self-pay | Admitting: *Deleted

## 2013-09-07 LAB — CERVICOVAGINAL ANCILLARY ONLY
Chlamydia: NEGATIVE
Neisseria Gonorrhea: NEGATIVE

## 2013-09-07 NOTE — Telephone Encounter (Signed)
Message copied by Valerie Roys on Mon Sep 07, 2013  5:08 PM ------      Message from: Boykin Nearing      Created: Mon Sep 07, 2013  2:39 PM       Please inform patient.       Negative HIV, RPR and hepatitis panel.        ------

## 2013-09-07 NOTE — Telephone Encounter (Signed)
Letter mailed to patient. Jazmin Hartsell,CMA  

## 2013-09-08 ENCOUNTER — Encounter: Payer: Self-pay | Admitting: *Deleted

## 2013-09-10 ENCOUNTER — Telehealth: Payer: Self-pay | Admitting: *Deleted

## 2013-09-10 ENCOUNTER — Encounter: Payer: Self-pay | Admitting: *Deleted

## 2013-09-10 NOTE — Telephone Encounter (Signed)
Message copied by Valerie Roys on Thu Sep 10, 2013  1:37 PM ------      Message from: Boykin Nearing      Created: Wed Sep 09, 2013  3:58 PM       Negative pap.      Please inform patient. ------

## 2013-09-10 NOTE — Telephone Encounter (Signed)
Letter mailed. Jazmin Hartsell,CMA  

## 2013-12-04 ENCOUNTER — Ambulatory Visit (INDEPENDENT_AMBULATORY_CARE_PROVIDER_SITE_OTHER): Payer: Medicaid Other | Admitting: *Deleted

## 2013-12-04 DIAGNOSIS — Z3042 Encounter for surveillance of injectable contraceptive: Secondary | ICD-10-CM

## 2013-12-04 DIAGNOSIS — Z3049 Encounter for surveillance of other contraceptives: Secondary | ICD-10-CM

## 2013-12-04 MED ORDER — MEDROXYPROGESTERONE ACETATE 150 MG/ML IM SUSP
150.0000 mg | Freq: Once | INTRAMUSCULAR | Status: AC
  Administered 2013-12-04: 150 mg via INTRAMUSCULAR

## 2013-12-04 NOTE — Progress Notes (Signed)
   Pt in for Depo Provera injection.  Pt tolerated Depo injection. Depo given Left upper outer quadrant.  Next injection due August 28-Sept 11, 2015.  Reminder card given. Derl Barrow, RN

## 2014-04-03 ENCOUNTER — Telehealth: Payer: Self-pay | Admitting: Family Medicine

## 2014-04-03 MED ORDER — ULIPRISTAL ACETATE 30 MG PO TABS: 1.0000 | ORAL_TABLET | Freq: Once | ORAL | Status: DC

## 2014-04-03 MED ORDER — LEVONORGESTREL 0.75 MG PO TABS: 0.7500 mg | ORAL_TABLET | Freq: Two times a day (BID) | ORAL | Status: DC

## 2014-04-03 NOTE — Telephone Encounter (Signed)
Patient called back to report that she had a negative pregnancy test. I sent in Onida for the patient as emergency contraception.   Tommi Rumps, MD [place] PGY-3

## 2014-04-03 NOTE — Telephone Encounter (Signed)
Amber Villarreal Emergency Line Call  Patient calls back stating that her pharmacy does not carry [facility]. She is asking for a prescription for Plan B. Prescription sent to pharmacy for Levonorgestrel 0.75 mg tablet to take every 12 hours x2 doses.   Tommi Rumps, MD [place] PGY-3
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
## 2014-04-09 ENCOUNTER — Other Ambulatory Visit: Payer: Self-pay

## 2014-04-09 DIAGNOSIS — Z1231 Encounter for screening mammogram for malignant neoplasm of breast: Secondary | ICD-10-CM

## 2014-04-26 ENCOUNTER — Encounter: Payer: Self-pay | Admitting: Family Medicine

## 2014-04-29 ENCOUNTER — Ambulatory Visit
Admission: RE | Admit: 2014-04-29 | Discharge: 2014-04-29 | Disposition: A | Discharge status: Home / Self Care | Payer: Medicaid Other | Source: Ambulatory Visit

## 2014-04-29 DIAGNOSIS — Z1231 Encounter for screening mammogram for malignant neoplasm of breast: Secondary | ICD-10-CM

## 2014-06-16 ENCOUNTER — Ambulatory Visit (INDEPENDENT_AMBULATORY_CARE_PROVIDER_SITE_OTHER): Payer: Medicaid Other | Admitting: *Deleted

## 2014-06-16 DIAGNOSIS — Z309 Encounter for contraceptive management, unspecified: Secondary | ICD-10-CM

## 2014-06-16 LAB — POCT URINE PREGNANCY: Preg Test, Ur: NEGATIVE

## 2014-06-16 MED ORDER — MEDROXYPROGESTERONE ACETATE 150 MG/ML IM SUSP
150.0000 mg | Freq: Once | INTRAMUSCULAR | Status: AC
  Administered 2014-06-16: 150 mg via INTRAMUSCULAR

## 2014-06-16 NOTE — Progress Notes (Signed)
Patient in today for depo provera. Negative pregnancy test obtained and injection given in left ventrogluteal, patient without complaints. Next depo due March 10 - March 24, reminder card given.

## 2014-11-08 ENCOUNTER — Encounter (HOSPITAL_COMMUNITY): Payer: Self-pay | Admitting: *Deleted

## 2014-11-08 ENCOUNTER — Emergency Department (HOSPITAL_COMMUNITY)
Admission: EM | Admit: 2014-11-08 | Discharge: 2014-11-08 | Disposition: A | Discharge status: Home / Self Care | Payer: Medicaid Other | Attending: Emergency Medicine | Admitting: Emergency Medicine

## 2014-11-08 DIAGNOSIS — Z79899 Other long term (current) drug therapy: Secondary | ICD-10-CM | POA: Insufficient documentation

## 2014-11-08 DIAGNOSIS — Z862 Personal history of diseases of the blood and blood-forming organs and certain disorders involving the immune mechanism: Secondary | ICD-10-CM | POA: Insufficient documentation

## 2014-11-08 DIAGNOSIS — Z7982 Long term (current) use of aspirin: Secondary | ICD-10-CM | POA: Diagnosis not present

## 2014-11-08 DIAGNOSIS — J029 Acute pharyngitis, unspecified: Secondary | ICD-10-CM

## 2014-11-08 DIAGNOSIS — B9689 Other specified bacterial agents as the cause of diseases classified elsewhere: Secondary | ICD-10-CM

## 2014-11-08 DIAGNOSIS — Z8659 Personal history of other mental and behavioral disorders: Secondary | ICD-10-CM | POA: Diagnosis not present

## 2014-11-08 DIAGNOSIS — M791 Myalgia, unspecified site: Secondary | ICD-10-CM

## 2014-11-08 DIAGNOSIS — N760 Acute vaginitis: Secondary | ICD-10-CM | POA: Diagnosis not present

## 2014-11-08 DIAGNOSIS — Z880 Allergy status to penicillin: Secondary | ICD-10-CM | POA: Insufficient documentation

## 2014-11-08 DIAGNOSIS — Z3202 Encounter for pregnancy test, result negative: Secondary | ICD-10-CM | POA: Diagnosis not present

## 2014-11-08 DIAGNOSIS — Z87891 Personal history of nicotine dependence: Secondary | ICD-10-CM | POA: Insufficient documentation

## 2014-11-08 LAB — URINALYSIS, ROUTINE W REFLEX MICROSCOPIC
BILIRUBIN URINE: NEGATIVE
Glucose, UA: NEGATIVE mg/dL
Hgb urine dipstick: NEGATIVE
KETONES UR: NEGATIVE mg/dL
Leukocytes, UA: NEGATIVE
NITRITE: NEGATIVE
Protein, ur: NEGATIVE mg/dL
Specific Gravity, Urine: 1.009 (ref 1.005–1.030)
Urobilinogen, UA: 0.2 mg/dL (ref 0.0–1.0)
pH: 6 (ref 5.0–8.0)

## 2014-11-08 LAB — COMPREHENSIVE METABOLIC PANEL
ALBUMIN: 3.4 g/dL — AB (ref 3.5–5.0)
ALK PHOS: 64 U/L (ref 38–126)
ALT: 24 U/L (ref 14–54)
AST: 19 U/L (ref 15–41)
Anion gap: 8 (ref 5–15)
BUN: 14 mg/dL (ref 6–20)
CHLORIDE: 106 mmol/L (ref 101–111)
CO2: 21 mmol/L — ABNORMAL LOW (ref 22–32)
Calcium: 8.7 mg/dL — ABNORMAL LOW (ref 8.9–10.3)
Creatinine, Ser: 1.24 mg/dL — ABNORMAL HIGH (ref 0.44–1.00)
GFR calc Af Amer: >60 mL/min (ref >60)
GFR calc non Af Amer: 54 mL/min — ABNORMAL LOW (ref >60)
Glucose, Bld: 106 mg/dL — ABNORMAL HIGH (ref 65–99)
POTASSIUM: 3.6 mmol/L (ref 3.5–5.1)
Sodium: 135 mmol/L (ref 135–145)
Total Bilirubin: 0.5 mg/dL (ref 0.3–1.2)
Total Protein: 6.3 g/dL — ABNORMAL LOW (ref 6.5–8.1)

## 2014-11-08 LAB — CBC WITH DIFFERENTIAL/PLATELET
Basophils Absolute: 0 10*3/uL (ref 0.0–0.1)
Basophils Relative: 0 % (ref 0–1)
Eosinophils Absolute: 0.1 10*3/uL (ref 0.0–0.7)
Eosinophils Relative: 1 % (ref 0–5)
HCT: 37.7 % (ref 36.0–46.0)
Hemoglobin: 13.2 g/dL (ref 12.0–15.0)
Lymphocytes Relative: 14 % (ref 12–46)
Lymphs Abs: 1.9 10*3/uL (ref 0.7–4.0)
MCH: 32.7 pg (ref 26.0–34.0)
MCHC: 35 g/dL (ref 30.0–36.0)
MCV: 93.3 fL (ref 78.0–100.0)
Monocytes Absolute: 0.5 10*3/uL (ref 0.1–1.0)
Monocytes Relative: 4 % (ref 3–12)
NEUTROS PCT: 81 % — AB (ref 43–77)
Neutro Abs: 11 10*3/uL — ABNORMAL HIGH (ref 1.7–7.7)
PLATELETS: 303 10*3/uL (ref 150–400)
RBC: 4.04 MIL/uL (ref 3.87–5.11)
RDW: 12.2 % (ref 11.5–15.5)
WBC: 13.6 10*3/uL — ABNORMAL HIGH (ref 4.0–10.5)

## 2014-11-08 LAB — RAPID STREP SCREEN (MED CTR MEBANE ONLY): Streptococcus, Group A Screen (Direct): NEGATIVE

## 2014-11-08 LAB — WET PREP, GENITAL
TRICH WET PREP: NONE SEEN
Yeast Wet Prep HPF POC: NONE SEEN

## 2014-11-08 LAB — LIPASE, BLOOD: LIPASE: 45 U/L (ref 22–51)

## 2014-11-08 LAB — POC URINE PREG, ED: Preg Test, Ur: NEGATIVE

## 2014-11-08 MED ORDER — CLINDAMYCIN HCL 150 MG PO CAPS: 300.0000 mg | ORAL_CAPSULE | Freq: Three times a day (TID) | ORAL | Status: DC

## 2014-11-08 MED ORDER — KETOROLAC TROMETHAMINE 60 MG/2ML IM SOLN
30.0000 mg | Freq: Once | INTRAMUSCULAR | Status: AC
  Administered 2014-11-08: 30 mg via INTRAMUSCULAR
  Filled 2014-11-08: qty 2

## 2014-11-08 NOTE — ED Notes (Signed)
Pt states that since this morning she feels like her organs want to stop working. States her kidneys, lungs, back and throat are all hurting. States that she has been having diarrhea today.

## 2014-11-08 NOTE — ED Notes (Signed)
*  Pt refuses BP check states it hurts her arm too much.

## 2014-11-08 NOTE — ED Provider Notes (Signed)
CSN: [id number]     Arrival date & time 11/08/14  1635 History   First MD Initiated Contact with Patient 11/08/14 1948     Chief Complaint  Patient presents with  . Diarrhea  . Sore Throat     (Consider location/radiation/quality/duration/timing/severity/associated sxs/prior Treatment) HPI Amber Villarreal is a 39 y.o. female with history of bipolar disorder, anemia, presents to emergency department complaining of generalized body aches, sore throat, back pain. Patient states I feel like all of my organs are hurting and pulsating and feeling to stop working and a minute now." Patient also reports diarrhea. She reports abdominal pain. Denies any nasal congestion. States couple days ago she had redness in her right eye which is now resolved. Patient denies fever chills. She denies any cough. Denies any congestion. States "my body just hurts." State everyone in her household had strep last week and was treated except for her.   Past Medical History  Diagnosis Date  . Bipolar disorder, unspecified   . Opioid type dependence, unspecified   . Anemia     history of  . Hx of cardiovascular stress test     a. Lex MV 2/14: EF 50%, no ischemia, small L breast nodule   Past Surgical History  Procedure Laterality Date  . Dilation and curettage of uterus     Family History  Problem Relation Age of Onset  . Heart disease Mother 75    bypass  . Diabetes Mother   . Hypertension Mother   . Cancer Mother     vaginal cancer  . Congestive Heart Failure Mother     early 51's  . Stroke Mother   . Coronary artery disease Mother   . Hypertension Sister   . Coronary artery disease    . Coronary artery disease Maternal Aunt    History  Substance Use Topics  . Smoking status: Former Smoker    Quit date: 06/25/2008  . Smokeless tobacco: Not on file  . Alcohol Use: 3.0 oz/week    3 Cans of beer, 2 Shots of liquor per week     Comment: Occasional   OB History    Gravida Para Term Preterm AB TAB  SAB Ectopic Multiple Living   6 4 4  0 2 1 1   4      Review of Systems  Constitutional: Negative for fever and chills.  HENT: Positive for sore throat. Negative for congestion.   Respiratory: Negative for cough, chest tightness and shortness of breath.   Cardiovascular: Negative for chest pain, palpitations and leg swelling.  Gastrointestinal: Positive for abdominal pain. Negative for nausea, vomiting and diarrhea.  Genitourinary: Positive for vaginal discharge. Negative for dysuria, flank pain, vaginal bleeding, vaginal pain and pelvic pain.  Musculoskeletal: Positive for back pain and arthralgias. Negative for myalgias, neck pain and neck stiffness.  Skin: Negative for rash.  Neurological: Positive for weakness. Negative for light-headedness and headaches.  All other systems reviewed and are negative.     Allergies  Metronidazole and Penicillins  Home Medications   Prior to Admission medications   Medication Sig Start Date End Date Taking? Authorizing Provider  Aspirin-Salicylamide-Caffeine (BC HEADACHE POWDER PO) Take 1 packet by mouth daily as needed (headache / pain).   Yes Historical Provider, MD  diphenhydrAMINE (BENADRYL) 25 MG tablet Take 25 mg by mouth every 6 (six) hours as needed for sleep.   Yes Historical Provider, MD  levonorgestrel (PLAN B,NEXT CHOICE) 0.75 MG tablet Take 1 tablet (0.75 mg total) by mouth  every 12 (twelve) hours. Patient not taking: Reported on 11/08/2014 04/03/14   Leone Haven, MD   BP 127/88 mmHg  Pulse 99  Temp(Src) 97.9 F (36.6 C) (Oral)  Resp 17  Ht 5' 10.5" (1.791 m)  Wt 213 lb (96.616 kg)  BMI 30.12 kg/m2  SpO2 91%  LMP 11/01/2014 Physical Exam  Constitutional: She is oriented to [place], place, and time. She appears well-developed and well-nourished. No distress.  HENT:  Head: Normocephalic and atraumatic.  Right Ear: External ear normal.  Left Ear: External ear normal.  Nose: Nose normal.  Eyes: Conjunctivae are normal.   Neck: Neck supple.  Cardiovascular: Normal rate, regular rhythm and normal heart sounds.   Pulmonary/Chest: Effort normal and breath sounds normal. No respiratory distress. She has no wheezes. She has no rales.  Abdominal: Soft. Bowel sounds are normal. She exhibits no distension. There is no tenderness. There is no rebound.  Genitourinary:  Normal external genitalia. Normal vaginal canal. Small thin white discharge. Cervix is normal, closed. No CMT. No uterine or adnexal tenderness. No masses palpated.    Musculoskeletal: She exhibits no edema.  Diffuse tenderness over lumbar paraspinal muscles. No midline tenderness. No pain with straight leg raise.  Neurological: She is alert and oriented to [place], place, and time.  Skin: Skin is warm and dry.  Psychiatric: She has a normal mood and affect. Her behavior is normal.  Nursing note and vitals reviewed.   ED Course  Procedures (including critical care time) Labs Review Labs Reviewed  WET PREP, GENITAL - Abnormal; Notable for the following:    Clue Cells Wet Prep HPF POC MANY (*)    WBC, Wet Prep HPF POC FEW (*)    All other components within normal limits  CBC WITH DIFFERENTIAL/PLATELET - Abnormal; Notable for the following:    WBC 13.6 (*)    Neutrophils Relative % 81 (*)    Neutro Abs 11.0 (*)    All other components within normal limits  COMPREHENSIVE METABOLIC PANEL - Abnormal; Notable for the following:    CO2 21 (*)    Glucose, Bld 106 (*)    Creatinine, Ser 1.24 (*)    Calcium 8.7 (*)    Total Protein 6.3 (*)    Albumin 3.4 (*)    GFR calc non Af Amer 54 (*)    All other components within normal limits  RAPID STREP SCREEN  CULTURE, GROUP A STREP  LIPASE, BLOOD  URINALYSIS, ROUTINE W REFLEX MICROSCOPIC  POC URINE PREG, ED  GC/CHLAMYDIA PROBE AMP ([place])    Imaging Review No results found.   EKG Interpretation None      MDM   Final diagnoses:  Pharyngitis  Myalgia  BV (bacterial vaginosis)    Patient is here with generalized body aches, sore throat, diarrhea. Her vital signs are normal. Will get labs, do pelvic exam, strep screen is pending.  10:07 PM Patient strep was negative. Her white count is slightly elevated. Creatinine slightly elevated as well. No further treatment needed an emergency department. Her vital signs are normal. She's afebrile. Oropharynx is erythematous, no exudate, no tonsillar swelling, uvula is midline. Patient states "everybody in the house has strep, I need something for muscle throat, no have strep." I told her I will prescribe her antibiotic but I strongly suggested to hold on to it until her cultures are back. In fact patient is allergic to penicillins and Flagyl (for treatment of her BV), so will prescribe clindamycin which will treat  both. Patient has follow-up with primary care tomorrow.  Filed Vitals:   11/08/14 2055 11/08/14 2138 11/08/14 2140 11/08/14 2215  BP:    125/82  Pulse: 81 81 87 82  Temp:    97.7 F (36.5 C)  TempSrc:    Oral  Resp:    14  Height:      Weight:      SpO2: 99% 97% 99% 100%      Jeannett Senior, PA-C 11/09/14 0129  Daleen Bo, MD 11/10/14 [phone]

## 2014-11-08 NOTE — Discharge Instructions (Signed)
Clindamycin as prescribed until all gone for bacterial vaginosis and for pharyngitis. Please follow with primary care doctor. Continue Tylenol and Motrin for body aches. Make sure to drink plenty of fluids to prevent dehydration.  Pharyngitis Pharyngitis is redness, pain, and swelling (inflammation) of your pharynx.  CAUSES  Pharyngitis is usually caused by infection. Most of the time, these infections are from viruses (viral) and are part of a cold. However, sometimes pharyngitis is caused by bacteria (bacterial). Pharyngitis can also be caused by allergies. Viral pharyngitis may be spread from [place] to [place] by coughing, sneezing, and personal items or utensils (cups, forks, spoons, toothbrushes). Bacterial pharyngitis may be spread from [place] to [place] by more intimate contact, such as kissing.  SIGNS AND SYMPTOMS  Symptoms of pharyngitis include:   Sore throat.   Tiredness (fatigue).   Low-grade fever.   Headache.  Joint pain and muscle aches.  Skin rashes.  Swollen lymph nodes.  Plaque-like film on throat or tonsils (often seen with bacterial pharyngitis). DIAGNOSIS  Your health care provider will ask you questions about your illness and your symptoms. Your medical history, along with a physical exam, is often all that is needed to diagnose pharyngitis. Sometimes, a rapid strep test is done. Other lab tests may also be done, depending on the suspected cause.  TREATMENT  Viral pharyngitis will usually get better in 3-4 days without the use of medicine. Bacterial pharyngitis is treated with medicines that kill germs (antibiotics).  HOME CARE INSTRUCTIONS   Drink enough water and fluids to keep your urine clear or pale yellow.   Only take over-the-counter or prescription medicines as directed by your health care provider:   If you are prescribed antibiotics, make sure you finish them even if you start to feel better.   Do not take aspirin.   Get lots of rest.    Gargle with 8 oz of salt water ( tsp of salt per 1 qt of water) as often as every 1-2 hours to soothe your throat.   Throat lozenges (if you are not at risk for choking) or sprays may be used to soothe your throat. SEEK MEDICAL CARE IF:   You have large, tender lumps in your neck.  You have a rash.  You cough up green, yellow-brown, or bloody spit. SEEK IMMEDIATE MEDICAL CARE IF:   Your neck becomes stiff.  You drool or are unable to swallow liquids.  You vomit or are unable to keep medicines or liquids down.  You have severe pain that does not go away with the use of recommended medicines.  You have trouble breathing (not caused by a stuffy nose). MAKE SURE YOU:   Understand these instructions.  Will watch your condition.  Will get help right away if you are not doing well or get worse. Document Released: 06/11/2005 Document Revised: 04/01/2013 Document Reviewed: 02/16/2013 [facility] Patient Information 2015 [place], [place]. This information is not intended to replace advice given to you by your health care provider. Make sure you discuss any questions you have with your health care provider.   Bacterial Vaginosis Bacterial vaginosis is a vaginal infection that occurs when the normal balance of bacteria in the vagina is disrupted. It results from an overgrowth of certain bacteria. This is the most common vaginal infection in women of childbearing age. Treatment is important to prevent complications, especially in pregnant women, as it can cause a premature delivery. CAUSES  Bacterial vaginosis is caused by an increase in harmful bacteria that are  normally present in smaller amounts in the vagina. Several different kinds of bacteria can cause bacterial vaginosis. However, the reason that the condition develops is not fully understood. RISK FACTORS Certain activities or behaviors can put you at an increased risk of developing bacterial vaginosis, including:  Having a new  sex partner or multiple sex partners.  Douching.  Using an intrauterine device (IUD) for contraception. Women do not get bacterial vaginosis from toilet seats, bedding, swimming pools, or contact with objects around them. SIGNS AND SYMPTOMS  Some women with bacterial vaginosis have no signs or symptoms. Common symptoms include:  Grey vaginal discharge.  A fishlike odor with discharge, especially after sexual intercourse.  Itching or burning of the vagina and vulva.  Burning or pain with urination. DIAGNOSIS  Your health care provider will take a medical history and examine the vagina for signs of bacterial vaginosis. A sample of vaginal fluid may be taken. Your health care provider will look at this sample under a microscope to check for bacteria and abnormal cells. A vaginal pH test may also be done.  TREATMENT  Bacterial vaginosis may be treated with antibiotic medicines. These may be given in the form of a pill or a vaginal cream. A second round of antibiotics may be prescribed if the condition comes back after treatment.  HOME CARE INSTRUCTIONS   Only take over-the-counter or prescription medicines as directed by your health care provider.  If antibiotic medicine was prescribed, take it as directed. Make sure you finish it even if you start to feel better.  Do not have sex until treatment is completed.  Tell all sexual partners that you have a vaginal infection. They should see their health care provider and be treated if they have problems, such as a mild rash or itching.  Practice safe sex by using condoms and only having one sex partner. SEEK MEDICAL CARE IF:   Your symptoms are not improving after 3 days of treatment.  You have increased discharge or pain.  You have a fever. MAKE SURE YOU:   Understand these instructions.  Will watch your condition.  Will get help right away if you are not doing well or get worse. FOR MORE INFORMATION  Centers for Disease Control  and Prevention, Division of STD Prevention: [employer]raiser[employer]raud[employer] American Sexual Health Association (ASHA): www.ashastd.org  Document Released: 06/11/2005 Document Revised: 04/01/2013 Document Reviewed: 01/21/2013 [facility] Patient Information 2015 [place], [place]. This information is not intended to replace advice given to you by your health care provider. Make sure you discuss any questions you have with your health care provider.

## 2014-11-09 ENCOUNTER — Encounter: Payer: Self-pay | Admitting: Family Medicine

## 2014-11-09 ENCOUNTER — Ambulatory Visit: Payer: Medicaid Other | Admitting: Family Medicine

## 2014-11-09 ENCOUNTER — Ambulatory Visit (INDEPENDENT_AMBULATORY_CARE_PROVIDER_SITE_OTHER): Payer: Medicaid Other | Admitting: Family Medicine

## 2014-11-09 VITALS — BP 134/90 | HR 98 | Temp 98.4°F | Ht 70.0 in | Wt 215.2 lb

## 2014-11-09 DIAGNOSIS — Z7251 High risk heterosexual behavior: Secondary | ICD-10-CM | POA: Diagnosis not present

## 2014-11-09 DIAGNOSIS — J029 Acute pharyngitis, unspecified: Secondary | ICD-10-CM | POA: Diagnosis not present

## 2014-11-09 DIAGNOSIS — Z113 Encounter for screening for infections with a predominantly sexual mode of transmission: Secondary | ICD-10-CM | POA: Diagnosis not present

## 2014-11-09 LAB — GC/CHLAMYDIA PROBE AMP (~~LOC~~) NOT AT ARMC
CHLAMYDIA, DNA PROBE: NEGATIVE
Neisseria Gonorrhea: NEGATIVE

## 2014-11-09 NOTE — Progress Notes (Signed)
    Subjective   Amber Villarreal is a 39 y.o. female that presents for a same day visit  SORE THROAT  Sore throat began 4 days ago. Pain is: feels like her throat is closing  Severity: 7/10 Medications tried: bc powders  Strep throat exposure: yes  STD exposure: unsure of her partner's status. Last engaged about a week ago.   Symptoms Fever: no, off and on  Cough: started yesterday  Runny nose: no Muscle aches: yes Swollen Glands: no Trouble breathing: no Drooling: no Weight loss: no  She was seen in the ED on 5/16. She was prescribed clindamycin. Rapid strep was negative and culture is pending.  High risk sexual behavior: she has engaged in unprotected sex. She is unsure of her partners habits. She has engaged in oral sex. She would like to be test for HIV and Syphilis.    History  Substance Use Topics  . Smoking status: Former Smoker    Quit date: 06/25/2008  . Smokeless tobacco: Not on file  . Alcohol Use: 3.0 oz/week    3 Cans of beer, 2 Shots of liquor per week     Comment: Occasional    ROS Per HPI  Objective   BP 134/90 mmHg  Pulse 98  Temp(Src) 98.4 F (36.9 C) (Oral)  Ht 5\' 10"  (1.778 m)  Wt 215 lb 4 oz (97.637 kg)  BMI 30.89 kg/m2  LMP 11/01/2014  General: well appearing, NAD, cooperative with exam  HEENT: No LAD, EOMI, clear conjunctiva, Erythema in oropharynx and no tonsillar exudates, boggy turbinates b/l, Tm's clear and intact b/l  Respiratory/Chest: non-labored, CTAB Extremities: Moves all freely  Skin: no rashes on hands or feet.   Assessment and Plan   Please refer to problem based charting of assessment and plan

## 2014-11-09 NOTE — Patient Instructions (Addendum)
Thank you for coming in,   I will call you with the results from today.    The clindamycin will treat any kind of step infection.   If your throat pain isn't getting better then please follow up with your primary doctor.     Please feel free to call with any questions or concerns at any time, at [phone]. --Dr. Raeford Razor

## 2014-11-10 ENCOUNTER — Encounter: Payer: Self-pay | Admitting: Family Medicine

## 2014-11-10 ENCOUNTER — Ambulatory Visit (INDEPENDENT_AMBULATORY_CARE_PROVIDER_SITE_OTHER): Payer: Medicaid Other | Admitting: *Deleted

## 2014-11-10 DIAGNOSIS — Z3042 Encounter for surveillance of injectable contraceptive: Secondary | ICD-10-CM

## 2014-11-10 DIAGNOSIS — J029 Acute pharyngitis, unspecified: Secondary | ICD-10-CM | POA: Insufficient documentation

## 2014-11-10 LAB — HIV ANTIBODY (ROUTINE TESTING W REFLEX): HIV 1&2 Ab, 4th Generation: NONREACTIVE

## 2014-11-10 LAB — RPR

## 2014-11-10 LAB — POCT URINE PREGNANCY: Preg Test, Ur: NEGATIVE

## 2014-11-10 MED ORDER — MEDROXYPROGESTERONE ACETATE 150 MG/ML IM SUSP
150.0000 mg | Freq: Once | INTRAMUSCULAR | Status: AC
  Administered 2014-11-10: 150 mg via INTRAMUSCULAR

## 2014-11-10 NOTE — Progress Notes (Signed)
   Pt late for Depo Provera injection.  Pregnancy test ordered; results negative. Pt tolerated Depo injection. Depo given right upper outer quadrant.  Next injection due August 3-February 09, 2015.  Reminder card given. Derl Barrow, RN

## 2014-11-10 NOTE — Assessment & Plan Note (Signed)
Evaluated in the ED yesterday. Rapid strep was negative. She was given clindamycin. Most likely this is related to URI or allergic rhinitis.  - she will complete ABX - will test for Gonorrhea and chlamydia throat cx

## 2014-11-10 NOTE — Assessment & Plan Note (Signed)
She is unsure of her partner's habits and would like to be tested for STD  - HIV and RPR

## 2014-11-11 LAB — GONOCOCCUS CULTURE

## 2014-11-11 LAB — CULTURE, GROUP A STREP

## 2014-11-12 ENCOUNTER — Telehealth: Payer: Self-pay | Admitting: Family Medicine

## 2014-11-12 NOTE — Progress Notes (Signed)
I was available as preceptor to resident for this patient's office visit.  

## 2014-11-12 NOTE — Telephone Encounter (Signed)
Called patient and informed of her lab work.   Rosemarie Ax, MD PGY-2, [facility] 11/12/2014, 8:26 AM

## 2014-11-26 ENCOUNTER — Ambulatory Visit: Payer: Medicaid Other | Admitting: Family Medicine

## 2014-11-26 ENCOUNTER — Telehealth: Payer: Self-pay | Admitting: Family Medicine

## 2014-11-26 NOTE — Telephone Encounter (Signed)
I see a note from Dr. Raeford Razor (who saw her in Clinic on 5/17) who called her with these results on 5/20.  Based on my review of her records, it looks like she was tested for STDs and these all came back negative.

## 2014-11-26 NOTE — Telephone Encounter (Signed)
Amber Villarreal went to the Emergency 5/17 and was told that someone would call her with results.  She's not heard anything and wanted someone from [facility] to contact her with the information.

## 2014-11-30 NOTE — Telephone Encounter (Signed)
Contacted pt and informed her of below and she stated that she was wanting the results from the test/labs that were run on her in the ER on the 11/08/2014.  She was also concerned about her upcoming appointment on 12/03/2014 about a man doing her exam, she stated that she has it in her information that she would like a female to do her exam, I told her that it looked like she didn't need a PAP but if the PCP felt that she did I am sure that they would accommodate her since she had made a prior request. Katharina Caper, Florentina Marquart D, CMA

## 2014-12-01 NOTE — Telephone Encounter (Signed)
We can discuss the results at her appt in 2 days.

## 2014-12-03 ENCOUNTER — Ambulatory Visit: Payer: Medicaid Other | Admitting: Family Medicine

## 2014-12-03 ENCOUNTER — Telehealth: Payer: Self-pay | Admitting: Family Medicine

## 2014-12-03 NOTE — Telephone Encounter (Signed)
Amber Villarreal would like to have her provider changed to a female.  Have always had females in the past

## 2014-12-07 NOTE — Telephone Encounter (Signed)
Called and spoke with patient.  Feels more comfortable with female providers and would like to change to female PCP.  Will change PCP to Dr. Gerlean Ren.  Burna Forts, BSN, RN-BC

## 2015-05-05 ENCOUNTER — Other Ambulatory Visit: Payer: Self-pay

## 2015-05-05 DIAGNOSIS — Z1231 Encounter for screening mammogram for malignant neoplasm of breast: Secondary | ICD-10-CM

## 2015-05-11 ENCOUNTER — Ambulatory Visit (INDEPENDENT_AMBULATORY_CARE_PROVIDER_SITE_OTHER): Payer: Medicaid Other | Admitting: Family Medicine

## 2015-05-11 ENCOUNTER — Encounter: Payer: Self-pay | Admitting: Family Medicine

## 2015-05-11 VITALS — BP 128/88 | HR 92 | Temp 98.0°F | Ht 69.5 in | Wt 207.4 lb

## 2015-05-11 DIAGNOSIS — Z23 Encounter for immunization: Secondary | ICD-10-CM | POA: Diagnosis not present

## 2015-05-11 DIAGNOSIS — K121 Other forms of stomatitis: Secondary | ICD-10-CM

## 2015-05-11 DIAGNOSIS — R21 Rash and other nonspecific skin eruption: Secondary | ICD-10-CM

## 2015-05-11 MED ORDER — CLINDAMYCIN PHOSPHATE 1 % EX LOTN: TOPICAL_LOTION | Freq: Two times a day (BID) | CUTANEOUS | Status: DC

## 2015-05-11 MED ORDER — TRIAMCINOLONE ACETONIDE 0.025 % EX OINT: 1.0000 "application " | TOPICAL_OINTMENT | Freq: Two times a day (BID) | CUTANEOUS | Status: DC

## 2015-05-11 NOTE — Patient Instructions (Signed)
Thank you so much for coming to visit [place] today! I have sent in a prescription for Triamcinolone, which you can use twice a day as needed. Please do not use this on your face, armpits, or groin. We will also do a swab your mouth--I will let you know the results.  Thanks again! Dr. Gerlean Ren

## 2015-05-12 DIAGNOSIS — R21 Rash and other nonspecific skin eruption: Secondary | ICD-10-CM | POA: Insufficient documentation

## 2015-05-12 NOTE — Assessment & Plan Note (Addendum)
-   Note refused to change to gown for full skin exam. Discussed that some lesions may be missed if skin cannot be fully evaluated. - Improvement noted with Clindamycin in the past. Refilled - Prescription for Triamcinolone given - Oral swab for gonorrhea and chlamydia obtained. - Follow up with Dermatology Clinic for biopsy of right arm. Discussed that it appears benign, but states she still wants it tested.

## 2015-05-12 NOTE — Progress Notes (Signed)
Subjective:     Patient ID: Amber Villarreal, female   DOB: 06/09/1976, 39 y.o.   MRN: [id number]  HPI [name] is a 39yo female presenting for skin rash and concern for oral STD. - Notes history of intermittent rash over the last several years. Frustrated that a diagnosis has not been made. - Rash blisters up and then becomes hyperpigmented - Rash is pruritic - Believes she had the shingles at one point. States she developed very painful vesicles along a dermatome of her right arm, which has resolved. - Concerned that one hyperpigmented lesions is melanoma. States she doesn't care if it doesn't look malignant, that she knows it is and wants it tested. Could not specific why she thinks it is melanoma, states she knows her body and knows it is something bad. - Also notes ulcerative lesion under her tongue and concerned that it is an STD. Denies vaginal discharge or vaginal lesions. States she had oral sex with her ex a few weeks ago. Note history of recent oral screen for STD a few weeks ago, but reports new exposure and new symptoms. - Denies fever.  - Rash is not present in family or friends.  - Chart Review:  First documented in 08/2012. Started after borrowing someone's deodorant. Started in axilla and spread to arms. Tried bleach on darker spots without relief. States son had "wig worm" and she was concerned about getting that or giving him her rash--sleeps in the same bed. Diagnosed with keloids and advised to not bleach skin. Steroid ointment prescribed. Note that she questioned Dr. Venetia Constable age (which she also did at this visit).  Next visit in 10/2012. Rash worsened with topical steroids. Described rash as waxing and waning vesicular rash, pruritic. Diagnosed with eczema and acne. Clindamycin gel prescribed for chest and back and steroid ointment given for arms. Follow up appointment a few weeks later revealed hyperpigmented papules on chest and upper extremity. Started BenzaClin with some  improvement. Benzaclin unavailable in pharmacy and replaced with Clindamycin Phosphate and Lamisil prescribed to use as needed.  Next visit in 08/2013. Complained of intermittent skin rashes x2-3 years that was getting worse in frequency and severity. Described as developing an itch that then blisters up and pops. Also described urticarial outbreaks on right arm and chest; usually resolved with Benadryl. Lab work up for autoimmune component completed and showed negative ANA, normal ESR,. Prednisone burst given.  Review of Systems Per HPI    Objective:   Physical Exam  Constitutional: She appears well-developed and well-nourished. No distress.  Cardiovascular: Normal rate and regular rhythm.  Exam reveals no gallop and no friction rub.   No murmur heard. Pulmonary/Chest: No respiratory distress. She has no wheezes. She has no rales.  Abdominal: Soft. She exhibits no distension. There is no tenderness. There is no rebound.  Musculoskeletal: She exhibits no edema.  Skin:  Hyperpigmented lesions noted on arms and legs. No active erythematous or vesicular lesions noted. Small oral ulcer noted on inferior portion of tongue. Nevi noted on right upper arm. Note refused to change to gown for full skin exam.       Assessment and Plan:     Rash and nonspecific skin eruption - Note refused to change to gown for full skin exam. Discussed that some lesions may be missed if skin cannot be fully evaluated. - Improvement noted with Clindamycin in the past. Refilled - Prescription for Triamcinolone given - Oral swab for gonorrhea and chlamydia obtained. - Follow up with  Dermatology Clinic for biopsy of right arm. Discussed that it appears benign, but states she still wants it tested.

## 2015-05-13 LAB — CHLAMYDIA CULTURE

## 2015-05-14 LAB — GONOCOCCUS CULTURE: ORGANISM ID, BACTERIA: NO GROWTH

## 2015-06-10 ENCOUNTER — Ambulatory Visit: Payer: Medicaid Other

## 2015-06-24 ENCOUNTER — Telehealth: Payer: Self-pay | Admitting: Family Medicine

## 2015-06-24 MED ORDER — CLINDAMYCIN PHOS-BENZOYL PEROX 1-5 % EX GEL: Freq: Two times a day (BID) | CUTANEOUS | Status: DC

## 2015-06-24 NOTE — Telephone Encounter (Signed)
Family Medicine After hours phone call  Patient seen by Dr. Gerlean Ren for what was believed eczema? (had Chlamydia swab but the result under chart review says the specimen was wrong). She feels the clindamycin has not been helping, and had remembered that the first time this happened she was given benzaclin. I sent in this benzaclin gel and informed her clinic would be open on Tuesday to follow up.  Tawanna Sat, MD 06/24/2015, 12:21 PM PGY-3, [place]

## 2015-08-14 ENCOUNTER — Telehealth: Payer: Self-pay | Admitting: Family Medicine

## 2015-08-14 MED ORDER — LEVONORGESTREL 0.75 MG PO TABS: 0.7500 mg | ORAL_TABLET | Freq: Two times a day (BID) | ORAL | Status: DC

## 2015-08-14 NOTE — Telephone Encounter (Signed)
After hours telephone call  Called Ms Phillip Heal back.  Patient reports that she needs Rx for Plan B.  She had unprotected sex last night.  She cannot afford to buy OTC.  Rx given adn described how to take.  Explained to patient the importance of making appt to get STD testing and get back on birth control tomorrow.  Virginia Crews, MD, MPH PGY-2,  [facility] 08/14/2015 9:42 AM

## 2015-08-31 ENCOUNTER — Ambulatory Visit (INDEPENDENT_AMBULATORY_CARE_PROVIDER_SITE_OTHER): Payer: Medicaid Other | Admitting: *Deleted

## 2015-08-31 DIAGNOSIS — Z3042 Encounter for surveillance of injectable contraceptive: Secondary | ICD-10-CM | POA: Diagnosis present

## 2015-08-31 LAB — POCT URINE PREGNANCY: Preg Test, Ur: NEGATIVE

## 2015-08-31 MED ORDER — MEDROXYPROGESTERONE ACETATE 150 MG/ML IM SUSP
150.0000 mg | Freq: Once | INTRAMUSCULAR | Status: AC
  Administered 2015-08-31: 150 mg via INTRAMUSCULAR

## 2015-08-31 NOTE — Progress Notes (Signed)
   Pt late for Depo Provera injection by several months.  Pregnancy test ordered; results negative. Patient stated she is on her menstrual cycle and has not had sex in a week or two.  Advised patient that she should use another reliable form of birth control for the next 7-10 days.  Advised patient to take another pregnancy test in about 7 days. Patient need to follow up with PCP for an annual exam ASAP and for current Depo Provera order.  Pt tolerated Depo injection. Depo given left upper outer quadrant.  Next injection due May 24-November 30, 2015.   Derl Barrow, RN

## 2015-09-15 ENCOUNTER — Encounter: Payer: Medicaid Other | Admitting: Family Medicine

## 2015-10-05 ENCOUNTER — Encounter: Payer: Self-pay | Admitting: Family Medicine

## 2015-10-05 ENCOUNTER — Other Ambulatory Visit (HOSPITAL_COMMUNITY)
Admission: RE | Admit: 2015-10-05 | Discharge: 2015-10-05 | Disposition: A | Discharge status: Home / Self Care | Payer: Medicaid Other | Source: Ambulatory Visit | Attending: Family Medicine | Admitting: Family Medicine

## 2015-10-05 ENCOUNTER — Ambulatory Visit (INDEPENDENT_AMBULATORY_CARE_PROVIDER_SITE_OTHER): Payer: Medicaid Other | Admitting: Family Medicine

## 2015-10-05 VITALS — BP 110/70 | HR 99 | Temp 98.2°F | Ht 70.0 in | Wt 205.0 lb

## 2015-10-05 DIAGNOSIS — Z1211 Encounter for screening for malignant neoplasm of colon: Secondary | ICD-10-CM | POA: Diagnosis not present

## 2015-10-05 DIAGNOSIS — Z800 Family history of malignant neoplasm of digestive organs: Secondary | ICD-10-CM | POA: Diagnosis not present

## 2015-10-05 DIAGNOSIS — D2261 Melanocytic nevi of right upper limb, including shoulder: Secondary | ICD-10-CM | POA: Diagnosis not present

## 2015-10-05 DIAGNOSIS — Z711 Person with feared health complaint in whom no diagnosis is made: Secondary | ICD-10-CM | POA: Diagnosis not present

## 2015-10-05 DIAGNOSIS — Z7251 High risk heterosexual behavior: Secondary | ICD-10-CM

## 2015-10-05 DIAGNOSIS — Z113 Encounter for screening for infections with a predominantly sexual mode of transmission: Secondary | ICD-10-CM | POA: Diagnosis present

## 2015-10-05 DIAGNOSIS — Z202 Contact with and (suspected) exposure to infections with a predominantly sexual mode of transmission: Secondary | ICD-10-CM | POA: Diagnosis present

## 2015-10-05 LAB — POCT WET PREP (WET MOUNT): Clue Cells Wet Prep Whiff POC: NEGATIVE

## 2015-10-05 NOTE — Patient Instructions (Addendum)
Thank you so much for coming to visit [place] today! I have placed a referral to Dermatology and to GI for colonoscopy. We checked several labs today. I will contact you with the results. Please schedule a lab visit at your convenience to check the remaining labs.  Thanks again! Dr. Gerlean Ren

## 2015-10-06 ENCOUNTER — Other Ambulatory Visit: Payer: Medicaid Other

## 2015-10-06 ENCOUNTER — Encounter: Payer: Self-pay | Admitting: Internal Medicine

## 2015-10-06 DIAGNOSIS — Z202 Contact with and (suspected) exposure to infections with a predominantly sexual mode of transmission: Secondary | ICD-10-CM

## 2015-10-06 LAB — BASIC METABOLIC PANEL WITH GFR
BUN: 11 mg/dL (ref 7–25)
CO2: 22 mmol/L (ref 20–31)
Calcium: 9.8 mg/dL (ref 8.6–10.2)
Chloride: 107 mmol/L (ref 98–110)
Creat: 0.83 mg/dL (ref 0.50–1.10)
GFR, EST NON AFRICAN AMERICAN: 88 mL/min (ref >=60)
GFR, Est African American: >89 mL/min (ref >=60)
Glucose, Bld: 94 mg/dL (ref 65–99)
Potassium: 4.2 mmol/L (ref 3.5–5.3)
SODIUM: 139 mmol/L (ref 135–146)

## 2015-10-06 LAB — CERVICOVAGINAL ANCILLARY ONLY
Chlamydia: NEGATIVE
Neisseria Gonorrhea: NEGATIVE

## 2015-10-06 LAB — HIV ANTIBODY (ROUTINE TESTING W REFLEX): HIV 1&2 Ab, 4th Generation: NONREACTIVE

## 2015-10-06 NOTE — Progress Notes (Signed)
Labs done today Mel Tadros 

## 2015-10-07 DIAGNOSIS — D2261 Melanocytic nevi of right upper limb, including shoulder: Secondary | ICD-10-CM | POA: Insufficient documentation

## 2015-10-07 DIAGNOSIS — Z800 Family history of malignant neoplasm of digestive organs: Secondary | ICD-10-CM | POA: Insufficient documentation

## 2015-10-07 LAB — RPR

## 2015-10-07 LAB — HEPATITIS PANEL, ACUTE
HCV Ab: NEGATIVE
Hep A IgM: NONREACTIVE
Hep B C IgM: NONREACTIVE
Hepatitis B Surface Ag: NEGATIVE

## 2015-10-07 NOTE — Assessment & Plan Note (Signed)
Referral to Dermatology

## 2015-10-07 NOTE — Progress Notes (Signed)
Subjective:     Patient ID: Amber Villarreal, female   DOB: 1976-02-15, 40 y.o.   MRN: [id number]  HPI Mrs. Dianah Field is a 40yo female presenting for STD screening. - Reports she has a new partner over the last several months and wants to get checked out - Does not always use protection. States she has tried to be more careful about it after having to call for plan B in 07/2015 - Using Depo for birth control - Denies genital ulcers, vaginal discharge, abnormal bleeding, abdominal pain, dysuria, increased urinary frequency or urgency - Specifically requests hepatitis and herpes although she does not have any known exposures. States she wants screening for all of the most common STDs. - Last Pap Smear 08/2013. Next due in 08/2018. - Also requests referral to GI for colonoscopy. Reports she had an uncle pass away from colon cancer in his 24s. States she was told by another physician several years ago that she needed a colonoscopy immediately for possible colon cancer, but she is unable to remember when this was recommended or for what symptoms. Denies bloody stools, unexplained weight loss. - Requests another referral to Dermatology for evaluation of mole on arm. Refuses to have it removed at [facility] or derm clinic here.  Review of Systems Per HPI. Other systems negative.    Objective:   Physical Exam  Constitutional: She appears well-developed and well-nourished. No distress.  Cardiovascular: Normal rate and regular rhythm.  Exam reveals no gallop and no friction rub.   No murmur heard. Pulmonary/Chest: Effort normal. No respiratory distress. She has no wheezes.  Genitourinary:  No genital ulcers noted. No vaginal discharge or bleeding noted.  Skin:  Mole noted on right upper arm.  Psychiatric: She has a normal mood and affect. Her behavior is normal.      Assessment and Plan:     High risk sexual behavior - STD screening today. Obtained swabs for Gonorrhea, Chlamydia, and Wet Prep. - Unable to  obtain lab draws today. To schedule lab visit to check BMP, RPR, HIV, Hepatitis Panel, and Herpes (no lesions to swab). - Continue to recommend condoms for prevention of STDs. Continue Depo shots for birth control. - Return as needed.    Family history of malignant neoplasm of gastrointestinal tract - Referral placed to GI for Colonoscopy.  - Reports history in uncle in 42s. Not a 1st degree relative.  - Discussed that screening is not likely indicated until 42yo. Wishes to discuss further with GI.  Melanocytic nevi of right upper limb, including shoulder - Referral to Dermatology

## 2015-10-07 NOTE — Assessment & Plan Note (Signed)
-   Referral placed to GI for Colonoscopy.  - Reports history in uncle in 42s. Not a 1st degree relative.  - Discussed that screening is not likely indicated until 40yo. Wishes to discuss further with GI.

## 2015-10-07 NOTE — Assessment & Plan Note (Signed)
-   STD screening today. Obtained swabs for Gonorrhea, Chlamydia, and Wet Prep. - Unable to obtain lab draws today. To schedule lab visit to check BMP, RPR, HIV, Hepatitis Panel, and Herpes (no lesions to swab). - Continue to recommend condoms for prevention of STDs. Continue Depo shots for birth control. - Return as needed.

## 2015-10-10 LAB — HSV(HERPES SMPLX)ABS-I+II(IGG+IGM)-BLD
HSV 1 GLYCOPROTEIN G AB, IGG: 26.4 {index} — AB (ref <0.90)
HSV 2 GLYCOPROTEIN G AB, IGG: 2.25 {index} — AB (ref <0.90)
Herpes Simplex Vrs I&II-IgM Ab (EIA): 0.28 INDEX

## 2015-10-11 ENCOUNTER — Telehealth: Payer: Self-pay | Admitting: Family Medicine

## 2015-10-11 NOTE — Telephone Encounter (Signed)
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 To return if lesions appear for treatment. Unsure if blisters in mouth are due to HSV since these have not been tested, but consider swabbing if ulcer reappears.

## 2015-11-15 ENCOUNTER — Telehealth: Payer: Self-pay | Admitting: *Deleted

## 2015-11-15 NOTE — Telephone Encounter (Signed)
Please have her schedule appointment to be seen if she is having itching. Exposure does not necessary mean she needs treatment per CDC guidelines.

## 2015-11-15 NOTE — Telephone Encounter (Signed)
Son was exposed to scabies in school. Mom wants to know if MD can send in rx for medicine for her to use or does she need to be seen.

## 2015-11-17 NOTE — Telephone Encounter (Signed)
Contacted pt mom and she stated that sons doctor gave her enough medicine for the whole household and appreciated the call back. Amber Villarreal, Amber Villarreal, [place]

## 2015-12-07 ENCOUNTER — Ambulatory Visit: Payer: Medicaid Other | Admitting: Internal Medicine

## 2015-12-07 ENCOUNTER — Telehealth: Payer: Self-pay | Admitting: Internal Medicine

## 2015-12-07 NOTE — Telephone Encounter (Signed)
No charge. 

## 2015-12-23 ENCOUNTER — Encounter (HOSPITAL_COMMUNITY): Payer: Self-pay

## 2015-12-23 ENCOUNTER — Emergency Department (HOSPITAL_COMMUNITY)
Admission: EM | Admit: 2015-12-23 | Discharge: 2015-12-23 | Disposition: A | Discharge status: Home / Self Care | Payer: Medicaid Other | Attending: Emergency Medicine | Admitting: Emergency Medicine

## 2015-12-23 ENCOUNTER — Emergency Department (HOSPITAL_COMMUNITY): Payer: Medicaid Other

## 2015-12-23 DIAGNOSIS — Z87891 Personal history of nicotine dependence: Secondary | ICD-10-CM | POA: Insufficient documentation

## 2015-12-23 DIAGNOSIS — Z7982 Long term (current) use of aspirin: Secondary | ICD-10-CM | POA: Diagnosis not present

## 2015-12-23 DIAGNOSIS — R51 Headache: Secondary | ICD-10-CM | POA: Insufficient documentation

## 2015-12-23 DIAGNOSIS — R519 Headache, unspecified: Secondary | ICD-10-CM

## 2015-12-23 MED ORDER — ONDANSETRON 4 MG PO TBDP: 4.0000 mg | ORAL_TABLET | Freq: Three times a day (TID) | ORAL | Status: DC | PRN

## 2015-12-23 MED ORDER — METOCLOPRAMIDE HCL 5 MG/ML IJ SOLN
10.0000 mg | Freq: Once | INTRAMUSCULAR | Status: AC
  Administered 2015-12-23: 10 mg via INTRAVENOUS
  Filled 2015-12-23: qty 2

## 2015-12-23 NOTE — ED Provider Notes (Signed)
CSN: [id number]     Arrival date & time 12/23/15  1230 History   First MD Initiated Contact with Patient 12/23/15 1235     Chief Complaint  Patient presents with  . Headache     (Consider location/radiation/quality/duration/timing/severity/associated sxs/prior Treatment) HPI Comments: Amber Villarreal is a 40 y.o. female with history of migraines presents to ED with complaint of headache. Onset of headache was yesterday afternoon - pain 4/10, gradual in onset, in crown/occipital region of head, described as a  "pulsing pressure." She took 800mg  of ibuprofen last night with minimal improvement. Headache has persisted today and she had an episode of paraesthesias and described a sensation of her right arm feeling "limp" lasting approximately 20 minutes. She is currently asx regarding paraesthesias. Headache tends to be worse with movement and sitting up. Denies photophobia or phonophobia. She has associated lightheadedness and nausea. She has a history of weekly migraines; however, states that this headache is different from previous. No head trauma.   Patient is a 40 y.o. female presenting with headaches. The history is provided by the patient and medical records.  Headache Associated symptoms: diarrhea ( states she is scheduled to see gastroenterologist), nausea and numbness (transient, currently asx)   Associated symptoms: no congestion, no cough, no drainage, no fever, no neck pain and no sore throat     Past Medical History  Diagnosis Date  . Bipolar disorder, unspecified ([place])   . Opioid type dependence, unspecified   . Anemia     history of  . Hx of cardiovascular stress test     a. Lex MV 2/14: EF 50%, no ischemia, small L breast nodule   Past Surgical History  Procedure Laterality Date  . Dilation and curettage of uterus     Family History  Problem Relation Age of Onset  . Heart disease Mother 87    bypass  . Diabetes Mother   . Hypertension Mother   . Cancer Mother    vaginal cancer  . Congestive Heart Failure Mother     early 110's  . Stroke Mother   . Coronary artery disease Mother   . Hypertension Sister   . Coronary artery disease    . Coronary artery disease Maternal Aunt    Social History  Substance Use Topics  . Smoking status: Former Smoker    Quit date: 06/25/2008  . Smokeless tobacco: None  . Alcohol Use: 3.0 oz/week    3 Cans of beer, 2 Shots of liquor per week     Comment: Occasional   OB History    Gravida Para Term Preterm AB TAB SAB Ectopic Multiple Living   6 4 4  0 2 1 1   4      Review of Systems  Constitutional: Negative for fever, chills and diaphoresis.  HENT: Negative for congestion, postnasal drip, rhinorrhea and sore throat.   Eyes: Negative for visual disturbance.  Respiratory: Negative for cough.   Cardiovascular: Negative for chest pain.  Gastrointestinal: Positive for nausea and diarrhea ( states she is scheduled to see gastroenterologist).  Genitourinary: Negative for dysuria and hematuria.  Musculoskeletal: Negative for neck pain.  Skin: Positive for rash ( chronic skin problems, scheduled to see specialist).  Neurological: Positive for light-headedness, numbness (transient, currently asx) and headaches.      Allergies  Metronidazole and Penicillins  Home Medications   Prior to Admission medications   Medication Sig Start Date End Date Taking? Authorizing Provider  Aspirin-Salicylamide-Caffeine (BC HEADACHE POWDER PO) Take 1 packet by  mouth daily as needed (headache / pain).    Historical Provider, MD  clindamycin-benzoyl peroxide (BENZACLIN) gel Apply topically 2 (two) times daily. 06/24/15   Leone Brand, MD  diphenhydrAMINE (BENADRYL) 25 MG tablet Take 25 mg by mouth every 6 (six) hours as needed for sleep.    Historical Provider, MD  levonorgestrel (PLAN B,NEXT CHOICE) 0.75 MG tablet Take 1 tablet (0.75 mg total) by mouth every 12 (twelve) hours. 08/14/15   Virginia Crews, MD  ondansetron  (ZOFRAN ODT) 4 MG disintegrating tablet Take 1 tablet (4 mg total) by mouth every 8 (eight) hours as needed for nausea or vomiting. 12/23/15   Roxanna Mew, PA-C  triamcinolone (KENALOG) 0.025 % ointment Apply 1 application topically 2 (two) times daily. Please do not use on face, armpits, or groin. 05/11/15   Moreland N Rumley, DO   BP 100/69 mmHg  Pulse 88  Temp(Src) 98.5 F (36.9 C) (Oral)  Resp 14  SpO2 100%  LMP 11/24/2015 Physical Exam  Constitutional: She appears well-developed and well-nourished. No distress.  HENT:  Head: Normocephalic and atraumatic.  Mouth/Throat: Oropharynx is clear and moist. No oropharyngeal exudate.  Eyes: Conjunctivae and EOM are normal. Pupils are equal, round, and reactive to light. Right eye exhibits no discharge. Left eye exhibits no discharge. No scleral icterus.  Neck: Normal range of motion. Neck supple. No muscular tenderness present. No rigidity. Normal range of motion present.  Cardiovascular: Normal rate, regular rhythm, normal heart sounds and intact distal pulses.   No murmur heard. Pulmonary/Chest: Effort normal and breath sounds normal. No respiratory distress.  Abdominal: Soft. Bowel sounds are normal. There is no tenderness. There is no rebound and no guarding.  Musculoskeletal: Normal range of motion.  Lymphadenopathy:    She has no cervical adenopathy.  Neurological: She is alert. She displays a negative Romberg sign. Coordination normal.  Mental Status:  Alert, thought content appropriate, able to give a coherent history. Speech fluent without evidence of aphasia. Able to follow 2 step commands without difficulty.  Cranial Nerves:  II:  Peripheral visual fields grossly normal, pupils equal, round, reactive to light III,IV, VI: ptosis not present, extra-ocular motions intact bilaterally  V,VII: smile symmetric, facial light touch sensation equal VIII: hearing grossly normal to voice  X: uvula elevates symmetrically  XI:  bilateral shoulder shrug symmetric and strong XII: midline tongue extension without fassiculations Motor:  Normal tone. 5/5 in upper and lower extremities bilaterally including strong and equal grip strength and dorsiflexion/plantar flexion Sensory: light touch normal in all extremities.  Cerebellar: normal finger-to-nose with bilateral upper extremities Gait: normal gait and balance CV: distal pulses palpable throughout   Skin: Skin is warm and dry. She is not diaphoretic.  Psychiatric: She has a normal mood and affect. Her behavior is normal.    ED Course  Procedures (including critical care time) Labs Review Labs Reviewed - No data to display  Imaging Review Ct Head Wo Contrast  12/23/2015  CLINICAL DATA:  Occipital headaches with nausea. Tingling in left arm earlier today. EXAM: CT HEAD WITHOUT CONTRAST TECHNIQUE: Contiguous axial images were obtained from the base of the skull through the vertex without intravenous contrast. COMPARISON:  June 19, 2003 FINDINGS: Paranasal sinuses, mastoid air cells, and bones are within normal limits. Extracranial soft tissues are normal. No subdural, epidural, or subarachnoid hemorrhage. No mass, mass effect or midline shift. Ventricles and sulci are normal. Cerebellum, brainstem, and basal cisterns are normal as well. No acute cortical ischemia or  infarct. IMPRESSION: 1. No acute intracranial abnormality. Electronically Signed   By: Dorise Bullion III M.D   On: 12/23/2015 14:41   I have personally reviewed and evaluated these images and lab results as part of my medical decision-making.   EKG Interpretation None      MDM   Final diagnoses:  Nonintractable headache, unspecified chronicity pattern, unspecified headache type   Patient is afebrile and non-toxic appearing in NAD. Vital signs are stable. Neurologic exam re-assuring. IV reglan given. No fever or photophobia, neck ROM intact, pt is alert - low suspicion for meningitis or  encephalitis. CT head negative - no subdural, SAH, epidural hematoma or intracranial mass.   Pt. Endorses improvement in pain to 2/10 and resolution of nausea. Suspect primary headache as etiology. Discussed results with pt. Rx. Anti-nausea. Symptomatic management with tylenol or ibuprofen. Follow up with PCP for re-evaluation. Provided return precautions. Pt. Voiced understanding and is agreeable.      Roxanna Mew, PA-C 12/24/15 [id number]  Orlie Dakin, MD 12/24/15 [phone]

## 2015-12-23 NOTE — ED Notes (Signed)
Pt. Reports having a headache that started at 0400 in the back of her head.   Alert and oriented X4.   No neuro deficits noted.

## 2015-12-23 NOTE — ED Notes (Signed)
Pt returned from CT. Pt hooked back up to monitors.

## 2015-12-23 NOTE — Discharge Instructions (Signed)
Read the information below.   Imaging of your head was re-assuring. You received medication in ED with improvement in your headache and nausea. You are being prescribed anti-nausea medication. Take as needed for nausea. You can take tylenol 650mg  every 6hrs or ibuprofen 400mg  every 6hrs as needed for headache relief.  Use the prescribed medication as directed.  Please discuss all new medications with your pharmacist.   It is important that you follow up with your PCP within the next week for re-evaluation.  You may return to the Emergency Department at any time for worsening condition or any new symptoms that concern you. Return to ED if your symptoms worsen or you develop facial droop, slurred speech, numbness, weakness, loss of consciousness, fever, or neck pain.    General Headache Without Cause A headache is pain or discomfort felt around the head or neck area. There are many causes and types of headaches. In some cases, the cause may not be found.  HOME CARE  Managing Pain  Take over-the-counter and prescription medicines only as told by your doctor.  Lie down in a dark, quiet room when you have a headache.  If directed, apply ice to the head and neck area:  Put ice in a plastic bag.  Place a towel between your skin and the bag.  Leave the ice on for 20 minutes, 2-3 times per day.  Use a heating pad or hot shower to apply heat to the head and neck area as told by your doctor.  Keep lights dim if bright lights bother you or make your headaches worse. Eating and Drinking  Eat meals on a regular schedule.  Lessen how much alcohol you drink.  Lessen how much caffeine you drink, or stop drinking caffeine. General Instructions  Keep all follow-up visits as told by your doctor. This is important.  Keep a journal to find out if certain things bring on headaches. For example, write down:  What you eat and drink.  How much sleep you get.  Any change to your diet or  medicines.  Relax by getting a massage or doing other relaxing activities.  Lessen stress.  Sit up straight. Do not tighten (tense) your muscles.  Do not use tobacco products. This includes cigarettes, chewing tobacco, or e-cigarettes. If you need help quitting, ask your doctor.  Exercise regularly as told by your doctor.  Get enough sleep. This often means 7-9 hours of sleep. GET HELP IF:  Your symptoms are not helped by medicine.  You have a headache that feels different than the other headaches.  You feel sick to your stomach (nauseous) or you throw up (vomit).  You have a fever. GET HELP RIGHT AWAY IF:   Your headache becomes really bad.  You keep throwing up.  You have a stiff neck.  You have trouble seeing.  You have trouble speaking.  You have pain in the eye or ear.  Your muscles are weak or you lose muscle control.  You lose your balance or have trouble walking.  You feel like you will pass out (faint) or you pass out.  You have confusion.   This information is not intended to replace advice given to you by your health care provider. Make sure you discuss any questions you have with your health care provider.   Document Released: 03/20/2008 Document Revised: 03/02/2015 Document Reviewed: 10/04/2014 Elsevier Interactive Patient Education [employer].

## 2015-12-23 NOTE — ED Provider Notes (Signed)
[place] of gradual onset occipital headache onset yesterday evening accompanied by nausea. She had transient tingling in her left arm earlier today which resolved spontaneously. She had no weakness in her left arm. No other associated symptoms. This headache is different from headaches that she gets almost weekly. On exam alert and in no distress Glasgow Coma Score 15 cranial nerves II through XII grossly intact gait normal Romberg normal pronator drift normal finger to nose normal motor strength 5 over 5 overall  Amber Dakin, MD 12/23/15 1341

## 2016-02-08 ENCOUNTER — Encounter: Payer: Self-pay | Admitting: Internal Medicine

## 2016-02-08 ENCOUNTER — Ambulatory Visit (INDEPENDENT_AMBULATORY_CARE_PROVIDER_SITE_OTHER): Payer: Medicaid Other | Admitting: Internal Medicine

## 2016-02-08 VITALS — BP 124/88 | HR 88 | Ht 70.0 in | Wt 209.5 lb

## 2016-02-08 DIAGNOSIS — R197 Diarrhea, unspecified: Secondary | ICD-10-CM | POA: Diagnosis not present

## 2016-02-08 DIAGNOSIS — K625 Hemorrhage of anus and rectum: Secondary | ICD-10-CM

## 2016-02-08 DIAGNOSIS — K219 Gastro-esophageal reflux disease without esophagitis: Secondary | ICD-10-CM | POA: Diagnosis not present

## 2016-02-08 MED ORDER — NA SULFATE-K SULFATE-MG SULF 17.5-3.13-1.6 GM/177ML PO SOLN: 1.0000 | Freq: Once | ORAL | 0 refills | Status: AC

## 2016-02-08 NOTE — Progress Notes (Signed)
HISTORY OF PRESENT ILLNESS:  Amber Villarreal is a 40 y.o. female who is sent by her family practice physician regarding GI complaints and the need for colonoscopy. Patient reports long-standing GI complaints. Principal complaints are that of diarrhea occurring proximally twice per week. Generally 3-4 bowel movements on those days. Otherwise 1 bowel movement per day. Associated with diarrhea is lower abdominal cramping that is relieved with defecation. She has had intermittent rectal bleeding. Generally bright red blood. She attributes this to hemorrhoids. She has not been evaluated. She does note as occasional dark stools rarely. She has had no weight loss. She does have occasional reflux symptoms such as heartburn. Mostly with dietary indiscretion. No dysphagia. She does mention bloating. Family history of colon cancer in an uncle. She is interested in colonoscopy. Last CBC one year ago revealed normal hemoglobin.  REVIEW OF SYSTEMS:  All non-GI ROS negative except for back pain, fatigue, headaches, muscle cramps, night sweats, shortness of breath, skin rash, sore throat  Past Medical History:  Diagnosis Date  . Anemia    history of  . Bipolar disorder, unspecified ([place])   . Hx of cardiovascular stress test    a. Lex MV 2/14: EF 50%, no ischemia, small L breast nodule  . Opioid type dependence, unspecified     Past Surgical History:  Procedure Laterality Date  . DILATION AND CURETTAGE OF UTERUS      Social History Amber Villarreal  reports that she quit smoking about 7 years ago. She has never used smokeless tobacco. She reports that she drinks about 3.0 oz of alcohol per week . She reports that she does not use drugs.  family history includes Cancer in her mother; Colon cancer in her maternal uncle; Congestive Heart Failure in her mother; Coronary artery disease in her maternal aunt and mother; Diabetes in her mother; Heart disease (age of onset: 14) in her mother; Hypertension in her mother  and sister; Stroke in her mother.  Allergies  Allergen Reactions  . Metronidazole Other (See Comments)    Heart hurt  . Penicillins     From when younger       PHYSICAL EXAMINATION: Vital signs: BP 124/88   Pulse 88   Ht 5\' 10"  (1.778 m)   Wt 209 lb 8 oz (95 kg)   BMI 30.06 kg/m   Constitutional: Pleasant, obese, generally well-appearing, no acute distress Psychiatric: alert and oriented x3, cooperative Eyes: extraocular movements intact, anicteric, conjunctiva pink Mouth: oral pharynx moist, no lesions Neck: supple no lymphadenopathy Cardiovascular: heart regular rate and rhythm, no murmur Lungs: clear to auscultation bilaterally Abdomen: soft, obese, nontender, nondistended, no obvious ascites, no peritoneal signs, normal bowel sounds, no organomegaly Rectal: Deferred until colonoscopy Extremities: no clubbing cyanosis or lower extremity edema bilaterally Skin: no lesions on visible extremities Neuro: No focal deficits. Normal DTRs  ASSESSMENT:  #1. Diarrhea predominant irritable bowel syndrome #2. GERD without alarm symptoms #3. Intermittent rectal bleeding.   PLAN:  #1. Increase fiber #2. Schedule colonoscopy to evaluate rectal bleeding and chronic diarrhea. The patient is not interested and sedation for her procedure ("I'm afraid I will not wake up") but understands that she could change her mind if she is too uncomfortable during the examination.The nature of the procedure, as well as the risks, benefits, and alternatives were carefully and thoroughly reviewed with the patient. Ample time for discussion and questions allowed. The patient understood, was satisfied, and agreed to proceed. #3. Reflux precautions

## 2016-02-08 NOTE — Patient Instructions (Signed)
You have been scheduled for a colonoscopy. Please follow written instructions given to you at your visit today.  Please pick up your prep supplies at the pharmacy within the next 1-3 days. If you use inhalers (even only as needed), please bring them with you on the day of your procedure.   

## 2016-03-10 ENCOUNTER — Ambulatory Visit (HOSPITAL_COMMUNITY)
Admission: EM | Admit: 2016-03-10 | Discharge: 2016-03-10 | Disposition: A | Discharge status: Home / Self Care | Payer: Medicaid Other | Attending: Family Medicine | Admitting: Family Medicine

## 2016-03-10 ENCOUNTER — Encounter (HOSPITAL_COMMUNITY): Payer: Self-pay | Admitting: *Deleted

## 2016-03-10 DIAGNOSIS — G8929 Other chronic pain: Secondary | ICD-10-CM

## 2016-03-10 DIAGNOSIS — H6091 Unspecified otitis externa, right ear: Secondary | ICD-10-CM

## 2016-03-10 DIAGNOSIS — M542 Cervicalgia: Secondary | ICD-10-CM

## 2016-03-10 MED ORDER — CYCLOBENZAPRINE HCL 5 MG PO TABS: 5.0000 mg | ORAL_TABLET | Freq: Three times a day (TID) | ORAL | 0 refills | Status: DC

## 2016-03-10 MED ORDER — NEOMYCIN-POLYMYXIN-HC 3.5-10000-1 OT SUSP: 4.0000 [drp] | Freq: Three times a day (TID) | OTIC | 0 refills | Status: DC

## 2016-03-10 MED ORDER — HYDROCODONE-ACETAMINOPHEN 5-325 MG PO TABS: 1.0000 | ORAL_TABLET | Freq: Four times a day (QID) | ORAL | 0 refills | Status: DC | PRN

## 2016-03-10 MED ORDER — DOXYCYCLINE HYCLATE 100 MG PO CAPS: 100.0000 mg | ORAL_CAPSULE | Freq: Two times a day (BID) | ORAL | 0 refills | Status: DC

## 2016-03-10 NOTE — ED Provider Notes (Signed)
[facility]    CSN: [id number] Arrival date & time: 03/10/16  1556  First Provider Contact:  First MD Initiated Contact with Patient 03/10/16 1704        History   Chief Complaint No chief complaint on file.   HPI Amber Villarreal is a 40 y.o. female.    Ear Drainage  This is a new problem. The current episode started more than 2 days ago. The problem has been gradually worsening. Associated symptoms include headaches.    Past Medical History:  Diagnosis Date  . Anemia    history of  . Bipolar disorder, unspecified ([place])   . Hx of cardiovascular stress test    a. Lex MV 2/14: EF 50%, no ischemia, small L breast nodule  . Opioid type dependence, unspecified     Patient Active Problem List   Diagnosis Date Noted  . Family history of malignant neoplasm of gastrointestinal tract 10/07/2015  . Melanocytic nevi of right upper limb, including shoulder 10/07/2015  . Sore throat 11/10/2014  . Alcohol abuse 09/04/2013  . High risk sexual behavior 09/19/2012  . Breast nodule 08/04/2012  . Obesity 06/01/2011  . BIPOLAR DISORDER UNSPECIFIED 06/10/2008    Past Surgical History:  Procedure Laterality Date  . DILATION AND CURETTAGE OF UTERUS      OB History    Gravida Para Term Preterm AB Living   6 4 4  0 2 4   SAB TAB Ectopic Multiple Live Births   1 1             Home Medications    Prior to Admission medications   Medication Sig Start Date End Date Taking? Authorizing Provider  Aspirin-Salicylamide-Caffeine (BC HEADACHE POWDER PO) Take 1 packet by mouth daily as needed (headache / pain).    Historical Provider, MD  clindamycin-benzoyl peroxide (BENZACLIN) gel Apply topically 2 (two) times daily. 06/24/15   Leone Brand, MD  ondansetron (ZOFRAN ODT) 4 MG disintegrating tablet Take 1 tablet (4 mg total) by mouth every 8 (eight) hours as needed for nausea or vomiting. 12/23/15   Roxanna Mew, PA-C    Family History Family History  Problem  Relation Age of Onset  . Heart disease Mother 66    bypass  . Diabetes Mother   . Hypertension Mother   . Cancer Mother     vaginal cancer  . Congestive Heart Failure Mother     early 37's  . Stroke Mother   . Coronary artery disease Mother   . Hypertension Sister   . Coronary artery disease    . Coronary artery disease Maternal Aunt   . Colon cancer Maternal Uncle   . Prostate cancer Neg Hx     Social History Social History  Substance Use Topics  . Smoking status: Former Smoker    Quit date: 06/25/2008  . Smokeless tobacco: Never Used  . Alcohol use 3.0 oz/week    3 Cans of beer, 2 Shots of liquor per week     Comment: Occasional     Allergies   Metronidazole and Penicillins   Review of Systems Review of Systems  Constitutional: Negative.   HENT: Positive for ear discharge and ear pain.   Neurological: Positive for headaches.  All other systems reviewed and are negative.    Physical Exam Triage Vital Signs ED Triage Vitals  Enc Vitals Group     BP      Pulse      Resp  Temp      Temp src      SpO2      Weight      Height      Head Circumference      Peak Flow      Pain Score      Pain Loc      Pain Edu?      Excl. in [place]?    No data found.   Updated Vital Signs There were no vitals taken for this visit.  Visual Acuity Right Eye Distance:   Left Eye Distance:   Bilateral Distance:    Right Eye Near:   Left Eye Near:    Bilateral Near:     Physical Exam  Constitutional: She appears well-developed and well-nourished. She appears distressed.  HENT:  Right Ear: Tympanic membrane normal. There is drainage, swelling and tenderness. No mastoid tenderness.  Left Ear: Tympanic membrane, external ear and ear canal normal. No drainage or swelling.  Mouth/Throat: Oropharynx is clear and moist.  Neck: Muscular tenderness present. Decreased range of motion present.  Cardiovascular: Normal rate, regular rhythm, normal heart sounds and intact  distal pulses.   Skin: Skin is warm and dry.     UC Treatments / Results  Labs (all labs ordered are listed, but only abnormal results are displayed) Labs Reviewed - No data to display  EKG  EKG Interpretation None       Radiology No results found.  Procedures Procedures (including critical care time)  Medications Ordered in UC Medications - No data to display   Initial Impression / Assessment and Plan / UC Course  I have reviewed the triage vital signs and the nursing notes.  Pertinent labs & imaging results that were available during my care of the patient were reviewed by me and considered in my medical decision making (see chart for details).  Clinical Course     treated for right OE, but requesting ER eval of left neck pain. Ch`ronic, worse past sev days.  Final Clinical Impressions(s) / UC Diagnoses   Final diagnoses:  Acute otitis externa, right  Neck pain of over 3 months duration    New Prescriptions New Prescriptions   No medications on file     Billy Fischer, MD 03/10/16 1728

## 2016-03-10 NOTE — ED Triage Notes (Signed)
Woke up  With  Neck    Pain      In    Her neck       With   Stiffness       R   Earache  X  3  Days   With  Drainage   With   Some     Pain     As  Needed

## 2016-03-10 NOTE — Discharge Instructions (Signed)
Use medicine as prescribed and see your doctor next week for recheck °

## 2016-03-15 ENCOUNTER — Ambulatory Visit: Payer: Medicaid Other

## 2016-03-16 ENCOUNTER — Ambulatory Visit: Payer: Medicaid Other

## 2016-04-05 ENCOUNTER — Ambulatory Visit (INDEPENDENT_AMBULATORY_CARE_PROVIDER_SITE_OTHER): Payer: Medicaid Other | Admitting: Family Medicine

## 2016-04-05 ENCOUNTER — Encounter: Payer: Self-pay | Admitting: Family Medicine

## 2016-04-05 ENCOUNTER — Ambulatory Visit: Payer: Medicaid Other

## 2016-04-05 ENCOUNTER — Telehealth: Payer: Self-pay | Admitting: Internal Medicine

## 2016-04-05 VITALS — BP 117/84 | HR 89 | Temp 98.3°F | Ht 70.0 in | Wt 208.0 lb

## 2016-04-05 DIAGNOSIS — Z30013 Encounter for initial prescription of injectable contraceptive: Secondary | ICD-10-CM | POA: Diagnosis not present

## 2016-04-05 DIAGNOSIS — Z308 Encounter for other contraceptive management: Secondary | ICD-10-CM | POA: Diagnosis present

## 2016-04-05 DIAGNOSIS — Z309 Encounter for contraceptive management, unspecified: Secondary | ICD-10-CM | POA: Insufficient documentation

## 2016-04-05 LAB — POCT URINE PREGNANCY: Preg Test, Ur: NEGATIVE

## 2016-04-05 MED ORDER — MEDROXYPROGESTERONE ACETATE 150 MG/ML IM SUSP
150.0000 mg | Freq: Once | INTRAMUSCULAR | Status: AC
  Administered 2016-04-05: 150 mg via INTRAMUSCULAR

## 2016-04-05 NOTE — Addendum Note (Signed)
Addended by: Katharina Caper, Adelheid Hoggard D on: 04/05/2016 10:53 AM   Modules accepted: Orders

## 2016-04-05 NOTE — Progress Notes (Signed)
   Subjective:    Patient ID: Amber Villarreal, female    DOB: [birth year], 40 y.o.   MRN: [id number]  HPI Overdue for depo.  U preg neg.  On menstrual period now.  She states she knows to be careful to avoid unintended pregnancy if she goes beyond 3 months between depo shots.  Also talked about BTL.  She is done having babies.  Due for mammo Refuses flu shot Having colonoscopy next week to FU rectal bleeding.  Heavy menses right now.  States happens when off depo. Light periods on depo.  Not previously worked up.    Review of Systems     Objective:   Physical Exam Well appearing.  No conjunctival or palmar paleness. Abd benign       Assessment & Plan:

## 2016-04-05 NOTE — Patient Instructions (Signed)
Depo today.  Every three months.  If longer, be careful to avoid unwanted pregnancy. As long as you are having periods, you have not gone through menopause.   If the heavy bleeding remains a problem, you will need an exam and an ultrasound. Get your mammogram Even though we disagree, I still recommend flu shots. Good luck with your colonoscopy.

## 2016-04-05 NOTE — Assessment & Plan Note (Signed)
Depo today.

## 2016-04-06 MED ORDER — NA SULFATE-K SULFATE-MG SULF 17.5-3.13-1.6 GM/177ML PO SOLN: ORAL | 0 refills | Status: DC

## 2016-04-06 NOTE — Telephone Encounter (Signed)
Patient left message with answering service regarding this.

## 2016-04-06 NOTE — Telephone Encounter (Signed)
rx sent Patient notified 

## 2016-04-11 ENCOUNTER — Encounter: Payer: Medicaid Other | Admitting: Internal Medicine

## 2016-04-11 ENCOUNTER — Telehealth: Payer: Self-pay | Admitting: Internal Medicine

## 2016-04-11 NOTE — Telephone Encounter (Signed)
Contacted by patient around 6 am Having onset of viral symptoms with stuffy nose, sore throat.  Son has been "very sick" this week with possible strep throat. She wished to reschedule because she was concerned about how she felt and did not wish to expose [place] to illness I will alert Dr. Henrene Pastor and his nurse, Vaughan Basta, to reschedule

## 2016-05-03 ENCOUNTER — Other Ambulatory Visit: Payer: Self-pay

## 2016-05-03 DIAGNOSIS — K625 Hemorrhage of anus and rectum: Secondary | ICD-10-CM

## 2016-05-03 MED ORDER — NA SULFATE-K SULFATE-MG SULF 17.5-3.13-1.6 GM/177ML PO SOLN: 1.0000 | Freq: Once | ORAL | 0 refills | Status: AC

## 2016-05-03 MED ORDER — NA SULFATE-K SULFATE-MG SULF 17.5-3.13-1.6 GM/177ML PO SOLN: ORAL | 0 refills | Status: DC

## 2016-05-03 NOTE — Addendum Note (Signed)
Addended by: Rosanne Sack R on: 05/03/2016 12:43 PM   Modules accepted: Orders

## 2016-05-03 NOTE — Telephone Encounter (Signed)
Pts colon rescheduled in the [facility] for 05/31/16@9am . Pt aware of appt, prep sent to pharmacy. Updated instructions mailed to pt.

## 2016-05-06 ENCOUNTER — Encounter (HOSPITAL_COMMUNITY): Payer: Self-pay

## 2016-05-06 ENCOUNTER — Emergency Department (HOSPITAL_COMMUNITY): Payer: Medicaid Other

## 2016-05-06 ENCOUNTER — Emergency Department (HOSPITAL_COMMUNITY)
Admission: EM | Admit: 2016-05-06 | Discharge: 2016-05-07 | Disposition: A | Discharge status: Home / Self Care | Payer: Medicaid Other | Attending: Emergency Medicine | Admitting: Emergency Medicine

## 2016-05-06 DIAGNOSIS — R51 Headache: Secondary | ICD-10-CM | POA: Insufficient documentation

## 2016-05-06 DIAGNOSIS — R519 Headache, unspecified: Secondary | ICD-10-CM

## 2016-05-06 DIAGNOSIS — R1012 Left upper quadrant pain: Secondary | ICD-10-CM

## 2016-05-06 DIAGNOSIS — Z87891 Personal history of nicotine dependence: Secondary | ICD-10-CM | POA: Diagnosis not present

## 2016-05-06 DIAGNOSIS — Z79899 Other long term (current) drug therapy: Secondary | ICD-10-CM | POA: Insufficient documentation

## 2016-05-06 DIAGNOSIS — R300 Dysuria: Secondary | ICD-10-CM | POA: Diagnosis not present

## 2016-05-06 DIAGNOSIS — Z7982 Long term (current) use of aspirin: Secondary | ICD-10-CM | POA: Insufficient documentation

## 2016-05-06 LAB — URINALYSIS, ROUTINE W REFLEX MICROSCOPIC
Bilirubin Urine: NEGATIVE
Glucose, UA: NEGATIVE mg/dL
Hgb urine dipstick: NEGATIVE
Ketones, ur: NEGATIVE mg/dL
Leukocytes, UA: NEGATIVE
Nitrite: NEGATIVE
Protein, ur: NEGATIVE mg/dL
Specific Gravity, Urine: 1.008 (ref 1.005–1.030)
pH: 6 (ref 5.0–8.0)

## 2016-05-06 LAB — COMPREHENSIVE METABOLIC PANEL WITH GFR
ALT: 22 U/L (ref 14–54)
AST: 18 U/L (ref 15–41)
Albumin: 4.1 g/dL (ref 3.5–5.0)
Alkaline Phosphatase: 42 U/L (ref 38–126)
Anion gap: 7 (ref 5–15)
BUN: 16 mg/dL (ref 6–20)
CO2: 24 mmol/L (ref 22–32)
Calcium: 9.6 mg/dL (ref 8.9–10.3)
Chloride: 107 mmol/L (ref 101–111)
Creatinine, Ser: 1.08 mg/dL — ABNORMAL HIGH (ref 0.44–1.00)
GFR calc Af Amer: >60 mL/min
GFR calc non Af Amer: >60 mL/min
Glucose, Bld: 103 mg/dL — ABNORMAL HIGH (ref 65–99)
Potassium: 3.9 mmol/L (ref 3.5–5.1)
Sodium: 138 mmol/L (ref 135–145)
Total Bilirubin: 0.5 mg/dL (ref 0.3–1.2)
Total Protein: 7.5 g/dL (ref 6.5–8.1)

## 2016-05-06 LAB — PREGNANCY, URINE: PREG TEST UR: NEGATIVE

## 2016-05-06 LAB — CBC WITH DIFFERENTIAL/PLATELET
Basophils Absolute: 0 K/uL (ref 0.0–0.1)
Basophils Relative: 0 %
Eosinophils Absolute: 0.1 K/uL (ref 0.0–0.7)
Eosinophils Relative: 1 %
HCT: 38.4 % (ref 36.0–46.0)
Hemoglobin: 13.6 g/dL (ref 12.0–15.0)
Lymphocytes Relative: 26 %
Lymphs Abs: 3.2 K/uL (ref 0.7–4.0)
MCH: 32.3 pg (ref 26.0–34.0)
MCHC: 35.4 g/dL (ref 30.0–36.0)
MCV: 91.2 fL (ref 78.0–100.0)
Monocytes Absolute: 0.6 K/uL (ref 0.1–1.0)
Monocytes Relative: 5 %
Neutro Abs: 8.1 K/uL — ABNORMAL HIGH (ref 1.7–7.7)
Neutrophils Relative %: 68 %
Platelets: 309 K/uL (ref 150–400)
RBC: 4.21 MIL/uL (ref 3.87–5.11)
RDW: 12.4 % (ref 11.5–15.5)
WBC: 12.1 K/uL — ABNORMAL HIGH (ref 4.0–10.5)

## 2016-05-06 LAB — LIPASE, BLOOD: Lipase: 41 U/L (ref 11–51)

## 2016-05-06 MED ORDER — PHENAZOPYRIDINE HCL 200 MG PO TABS: 200.0000 mg | ORAL_TABLET | Freq: Three times a day (TID) | ORAL | 0 refills | Status: DC

## 2016-05-06 MED ORDER — RANITIDINE HCL 150 MG PO CAPS: 150.0000 mg | ORAL_CAPSULE | Freq: Every day | ORAL | 0 refills | Status: DC

## 2016-05-06 MED ORDER — PROCHLORPERAZINE EDISYLATE 5 MG/ML IJ SOLN
10.0000 mg | Freq: Once | INTRAMUSCULAR | Status: AC
  Administered 2016-05-06: 10 mg via INTRAVENOUS
  Filled 2016-05-06: qty 2

## 2016-05-06 NOTE — ED Notes (Signed)
Pt requesting water. Explained to pt that we need to wait on the EDP to see her and that we dont want her to drink at this time when she is vomiting.

## 2016-05-06 NOTE — ED Provider Notes (Signed)
[facility] DEPT Provider Note   CSN: [id number] Arrival date & time: 05/06/16  2014     History   Chief Complaint Chief Complaint  Patient presents with  . Abdominal Pain    HPI Jentrie Misek is a 40 y.o. female with a past medical history of bipolar who presents to the ED today with multiple complaints. Patient states that she has been experiencing intermittent left-sided headaches for the last several months. She states that she experiences a sharp pain sensation at the base of the left side of her head that radiates up into her frontal lobe. She experiences associated intermittent blurry vision and dizziness, sensation like the room is spinning. Patient states that she was seen in the ED previously for this and was given a medication but her symptoms never got better. Patient states that "I don't think they did anything for me and this headache scares me some much I'm afraid I'm going to die". Patient states tonight she was making dinner and she experienced similar headache and dizziness and states it was worse this time so she came to the ED for further evaluation. Patient is also complaining of sharp left upper quadrant pain that has been intermittently present since 2012. She states the pain is typically worse when she goes to sleep. She states drinking water typically makes the pain feel better. She denies any vomiting or diarrhea, melena or hematochezia. She notes that she does take an excessive amount of BC powders and aspirin for pain. Patient also states that for the last 2-3 days she is experienced burning with urination. She also has an itchiness sensation. She denies any vaginal discharge. She felt that she had a yeast infection so she soaked and apple cider vinegar and baking soda without relief of her symptoms. She denies any fevers, neck stiffness.  HPI  Past Medical History:  Diagnosis Date  . Anemia    history of  . Bipolar disorder, unspecified   . Hx of  cardiovascular stress test    a. Lex MV 2/14: EF 50%, no ischemia, small L breast nodule  . Opioid type dependence, unspecified     Patient Active Problem List   Diagnosis Date Noted  . Contraceptive management 04/05/2016  . Family history of malignant neoplasm of gastrointestinal tract 10/07/2015  . Melanocytic nevi of right upper limb, including shoulder 10/07/2015  . Alcohol abuse 09/04/2013  . High risk sexual behavior 09/19/2012  . Breast nodule 08/04/2012  . Obesity 06/01/2011  . BIPOLAR DISORDER UNSPECIFIED 06/10/2008    Past Surgical History:  Procedure Laterality Date  . DILATION AND CURETTAGE OF UTERUS      OB History    Gravida Para Term Preterm AB Living   6 4 4  0 2 4   SAB TAB Ectopic Multiple Live Births   1 1             Home Medications    Prior to Admission medications   Medication Sig Start Date End Date Taking? Authorizing Provider  aspirin 325 MG EC tablet Take 650 mg by mouth every 6 (six) hours as needed for pain.   Yes Historical Provider, MD  Aspirin-Salicylamide-Caffeine (BC HEADACHE POWDER PO) Take 1 packet by mouth daily as needed (headache / pain).   Yes Historical Provider, MD  clindamycin-benzoyl peroxide (BENZACLIN) gel Apply topically 2 (two) times daily. Patient not taking: Reported on 05/06/2016 06/24/15   Leone Brand, MD  cyclobenzaprine (FLEXERIL) 5 MG tablet Take 1 tablet (5 mg total)  by mouth 3 (three) times daily. As muscle relaxer Patient taking differently: Take 5 mg by mouth 3 (three) times daily as needed for muscle spasms. As muscle relaxer 03/10/16   Billy Fischer, MD  doxycycline (VIBRAMYCIN) 100 MG capsule Take 1 capsule (100 mg total) by mouth 2 (two) times daily. Patient not taking: Reported on 05/06/2016 03/10/16   Billy Fischer, MD  HYDROcodone-acetaminophen (NORCO/VICODIN) 5-325 MG tablet Take 1 tablet by mouth every 6 (six) hours as needed. Prn pain Patient not taking: Reported on 05/06/2016 03/10/16   Billy Fischer, MD    Na Sulfate-K Sulfate-Mg Sulf 17.5-3.13-1.6 GM/180ML SOLN Take as directed per procedure instructions Patient not taking: Reported on 05/06/2016 05/03/16   Irene Shipper, MD  neomycin-polymyxin-hydrocortisone (CORTISPORIN) 3.5-10000-1 otic suspension Place 4 drops into the right ear 3 (three) times daily. Patient not taking: Reported on 05/06/2016 03/10/16   Billy Fischer, MD  ondansetron (ZOFRAN ODT) 4 MG disintegrating tablet Take 1 tablet (4 mg total) by mouth every 8 (eight) hours as needed for nausea or vomiting. Patient not taking: Reported on 05/06/2016 12/23/15   Roxanna Mew, PA-C    Family History Family History  Problem Relation Age of Onset  . Heart disease Mother 65    bypass  . Diabetes Mother   . Hypertension Mother   . Cancer Mother     vaginal cancer  . Congestive Heart Failure Mother     early 31's  . Stroke Mother   . Coronary artery disease Mother   . Hypertension Sister   . Coronary artery disease    . Coronary artery disease Maternal Aunt   . Colon cancer Maternal Uncle   . Prostate cancer Neg Hx     Social History Social History  Substance Use Topics  . Smoking status: Former Smoker    Quit date: 06/25/2008  . Smokeless tobacco: Never Used  . Alcohol use 3.0 oz/week    3 Cans of beer, 2 Shots of liquor per week     Comment: Occasional     Allergies   Metronidazole and Penicillins   Review of Systems Review of Systems  All other systems reviewed and are negative.    Physical Exam Updated Vital Signs BP 121/87 (BP Location: Left Arm)   Pulse 84   Temp 98.6 F (37 C) (Oral)   Resp 20   Ht 5\' 9"  (1.753 m)   Wt 94.3 kg   SpO2 95%   BMI 30.72 kg/m   Physical Exam  Constitutional: She is oriented to [place], place, and time. She appears well-developed and well-nourished. No distress.  HENT:  Head: Normocephalic and atraumatic.  Mouth/Throat: No oropharyngeal exudate.  Eyes: Conjunctivae and EOM are normal. Pupils are equal, round,  and reactive to light. Right eye exhibits no discharge. Left eye exhibits no discharge. No scleral icterus.  Neck: Normal range of motion. Neck supple.  No meningismus  Cardiovascular: Normal rate, regular rhythm, normal heart sounds and intact distal pulses.  Exam reveals no gallop and no friction rub.   No murmur heard. Pulmonary/Chest: Effort normal and breath sounds normal. No respiratory distress. She has no wheezes. She has no rales. She exhibits no tenderness.  Abdominal: Soft. Bowel sounds are normal. She exhibits no distension and no mass. There is tenderness ( Mild left upper quadrant TTP). There is no rebound and no guarding. No hernia.  Musculoskeletal: Normal range of motion. She exhibits no edema.  Lymphadenopathy:    She has  no cervical adenopathy.  Neurological: She is alert and oriented to [place], place, and time. No cranial nerve deficit.  Strength 5/5 throughout. No sensory deficits. No gait abnormality. No dysmetria. No slurred speech. No facial droop.    Skin: Skin is warm and dry. No rash noted. She is not diaphoretic. No erythema. No pallor.  Psychiatric: She has a normal mood and affect. Her behavior is normal.  Nursing note and vitals reviewed.    ED Treatments / Results  Labs (all labs ordered are listed, but only abnormal results are displayed) Labs Reviewed  COMPREHENSIVE METABOLIC PANEL - Abnormal; Notable for the following:       Result Value   Glucose, Bld 103 (*)    Creatinine, Ser 1.08 (*)    All other components within normal limits  CBC WITH DIFFERENTIAL/PLATELET - Abnormal; Notable for the following:    WBC 12.1 (*)    Neutro Abs 8.1 (*)    All other components within normal limits  LIPASE, BLOOD  URINALYSIS, ROUTINE W REFLEX MICROSCOPIC (NOT AT [facility])  PREGNANCY, URINE  I-STAT BETA HCG BLOOD, ED ([facility], [facility], [facility] ONLY)    EKG  EKG Interpretation None       Radiology No results found.  Procedures Procedures (including critical care  time)  Medications Ordered in ED Medications  prochlorperazine (COMPAZINE) injection 10 mg (not administered)     Initial Impression / Assessment and Plan / ED Course  I have reviewed the triage vital signs and the nursing notes.  Pertinent labs & imaging results that were available during my care of the patient were reviewed by me and considered in my medical decision making (see chart for details).  Clinical Course     40 year old female with multiple psych disorders presents to the ED today with multiple complaints. Patient complaining of severe left-sided headache associated dizziness. Symptoms have been intermittent for several months. On presentation the patient overall appears well. Vitals are stable. No neurological deficits noted on exam. She said she came to the ED with similar symptoms and "did not have anything done". Patient is adamant that she wants to know if her symptoms anything going on inside of her brain. CT head ordered which was unremarkable. She was given Compazine for migraine with significant symptomatic relief. Patient also complaining of left upper quadrant abdominal pain that has been present for several years. Abdomen is soft in without rigidity. Mild left upper quadrant TTP. Labwork unremarkable. Patient states she takes a lot of BC powders and aspirin. She likely has gastritis or peptic ulcer disease secondary to NSAIDs. Recommend discontinuing inserted use. We'll give PPI and GI follow-up. Abdomen is nonsurgical. Doubt appendicitis, diverticulitis, bowel obstruction. Bowel movements have been normal. Patient Magda Paganini complaining of dysuria 3 days. UA is completely clean. We'll prescribe Pyridium for discomfort. She is denying any vaginal discharge at this time. Return precautions outlined in patient discharge instructions. Patient was also given neurology follow-up and ongoing headaches.   Final Clinical Impressions(s) / ED Diagnoses   Final diagnoses:  LUQ pain   Nonintractable headache, unspecified chronicity pattern, unspecified headache type  Dysuria    New Prescriptions New Prescriptions   No medications on file     Carlos Levering, PA-C 05/09/16 1535    Lacretia Leigh, MD 05/10/16 [phone]

## 2016-05-06 NOTE — ED Notes (Signed)
Pt states "the room smells like blood."

## 2016-05-06 NOTE — ED Notes (Signed)
Pt transported to CT ?

## 2016-05-06 NOTE — Discharge Instructions (Signed)
Take Pyridium for burning with urination. Drink only fluids. Take Excedrin or Tylenol as needed for headaches. Follow with neurology for reevaluation. Avoid ibuprofen or aspirin. Take ranitidine or left upper quadrant pain and likely gastritis. Avoid acidic and spicy foods.

## 2016-05-06 NOTE — ED Triage Notes (Signed)
Left sided abdominal pain for over a week states pain radiates from left side to back area over bil flank area with nausea no fever states thinks she has a yeast infection.  Right sided head pain for awhile off and on and states her head is swimming alert and oriented x 3 with blurred vision and states shortness of breath and tingling in feet and legs.

## 2016-05-06 NOTE — ED Notes (Signed)
Pt ambulated to the restroom with out difficulty.

## 2016-05-23 ENCOUNTER — Inpatient Hospital Stay (HOSPITAL_COMMUNITY)
Admission: AD | Admit: 2016-05-23 | Discharge: 2016-05-23 | Disposition: A | Discharge status: Home / Self Care | Payer: Medicaid Other | Source: Ambulatory Visit | Attending: Obstetrics & Gynecology | Admitting: Obstetrics & Gynecology

## 2016-05-23 ENCOUNTER — Encounter (HOSPITAL_COMMUNITY): Payer: Self-pay | Admitting: *Deleted

## 2016-05-23 DIAGNOSIS — F319 Bipolar disorder, unspecified: Secondary | ICD-10-CM | POA: Insufficient documentation

## 2016-05-23 DIAGNOSIS — Z8249 Family history of ischemic heart disease and other diseases of the circulatory system: Secondary | ICD-10-CM | POA: Insufficient documentation

## 2016-05-23 DIAGNOSIS — Z79899 Other long term (current) drug therapy: Secondary | ICD-10-CM | POA: Insufficient documentation

## 2016-05-23 DIAGNOSIS — Z7982 Long term (current) use of aspirin: Secondary | ICD-10-CM | POA: Diagnosis not present

## 2016-05-23 DIAGNOSIS — Z87891 Personal history of nicotine dependence: Secondary | ICD-10-CM | POA: Insufficient documentation

## 2016-05-23 DIAGNOSIS — Z833 Family history of diabetes mellitus: Secondary | ICD-10-CM | POA: Insufficient documentation

## 2016-05-23 DIAGNOSIS — Z888 Allergy status to other drugs, medicaments and biological substances status: Secondary | ICD-10-CM | POA: Diagnosis not present

## 2016-05-23 DIAGNOSIS — Z880 Allergy status to penicillin: Secondary | ICD-10-CM | POA: Diagnosis not present

## 2016-05-23 DIAGNOSIS — B354 Tinea corporis: Secondary | ICD-10-CM | POA: Insufficient documentation

## 2016-05-23 DIAGNOSIS — R590 Localized enlarged lymph nodes: Secondary | ICD-10-CM | POA: Diagnosis not present

## 2016-05-23 DIAGNOSIS — Z823 Family history of stroke: Secondary | ICD-10-CM | POA: Insufficient documentation

## 2016-05-23 DIAGNOSIS — R102 Pelvic and perineal pain: Secondary | ICD-10-CM | POA: Diagnosis present

## 2016-05-23 LAB — URINE MICROSCOPIC-ADD ON

## 2016-05-23 LAB — WET PREP, GENITAL
CLUE CELLS WET PREP: NONE SEEN
Sperm: NONE SEEN
Trich, Wet Prep: NONE SEEN
Yeast Wet Prep HPF POC: NONE SEEN

## 2016-05-23 LAB — URINALYSIS, ROUTINE W REFLEX MICROSCOPIC
Bilirubin Urine: NEGATIVE
Glucose, UA: NEGATIVE mg/dL
Ketones, ur: NEGATIVE mg/dL
LEUKOCYTES UA: NEGATIVE
NITRITE: NEGATIVE
PROTEIN: NEGATIVE mg/dL
SPECIFIC GRAVITY, URINE: 1.025 (ref 1.005–1.030)
pH: 5.5 (ref 5.0–8.0)

## 2016-05-23 LAB — POCT PREGNANCY, URINE: PREG TEST UR: NEGATIVE

## 2016-05-23 NOTE — MAU Provider Note (Signed)
History     CSN: [id number]  Arrival date and time: 05/23/16 1756   First Provider Initiated Contact with Patient 05/23/16 2038      Chief Complaint  Patient presents with  . Pelvic Pain   Pelvic Pain  The patient's primary symptoms include genital lesions and pelvic pain. This is a new problem. The current episode started in the past 7 days. The problem occurs constantly. The problem has been gradually worsening. Pain severity now: 9/10  She is not pregnant. Associated symptoms include abdominal pain. Pertinent negatives include no chills, constipation, diarrhea, dysuria, fever, frequency, nausea, urgency or vomiting. The vaginal discharge was white. There has been no bleeding. The symptoms are aggravated by activity. Treatments tried: excedrin  The treatment provided significant relief. She is sexually active. She uses progestin injections for contraception. Her menstrual history has been irregular (last had a period about 3 months ago. Periods irregular from depo. ).   Past Medical History:  Diagnosis Date  . Anemia    history of  . Bipolar disorder, unspecified   . Hx of cardiovascular stress test    a. Lex MV 2/14: EF 50%, no ischemia, small L breast nodule  . Opioid type dependence, unspecified     Past Surgical History:  Procedure Laterality Date  . DILATION AND CURETTAGE OF UTERUS      Family History  Problem Relation Age of Onset  . Heart disease Mother 20    bypass  . Diabetes Mother   . Hypertension Mother   . Cancer Mother     vaginal cancer  . Congestive Heart Failure Mother     early 25's  . Stroke Mother   . Coronary artery disease Mother   . Hypertension Sister   . Coronary artery disease    . Coronary artery disease Maternal Aunt   . Colon cancer Maternal Uncle   . Prostate cancer Neg Hx     Social History  Substance Use Topics  . Smoking status: Former Smoker    Quit date: 06/25/2008  . Smokeless tobacco: Never Used  . Alcohol use 3.0 oz/week     3 Cans of beer, 2 Shots of liquor per week     Comment: Occasional    Allergies:  Allergies  Allergen Reactions  . Metronidazole Other (See Comments)    Heart hurt  . Penicillins     From when younger Has patient had a PCN reaction causing immediate rash, facial/tongue/throat swelling, SOB or lightheadedness with hypotension: No Has patient had a PCN reaction causing severe rash involving mucus membranes or skin necrosis: No Has patient had a PCN reaction that required hospitalization No Has patient had a PCN reaction occurring within the last 10 years: No If all of the above answers are "NO", then may proceed with Cephalosporin use.     Prescriptions Prior to Admission  Medication Sig Dispense Refill Last Dose  . aspirin-acetaminophen-caffeine (EXCEDRIN MIGRAINE) 250-250-65 MG tablet Take 1 tablet by mouth every 6 (six) hours as needed for headache.   05/22/2016 at Unknown time  . Multiple Vitamin (MULTIVITAMIN WITH MINERALS) TABS tablet Take 1 tablet by mouth daily.   05/22/2016 at Unknown time  . aspirin 325 MG EC tablet Take 650 mg by mouth every 6 (six) hours as needed for pain.   05/05/2016 at Unknown time  . Aspirin-Salicylamide-Caffeine (BC HEADACHE POWDER PO) Take 1 packet by mouth daily as needed (headache / pain).   Past Week at Unknown time  . clindamycin-benzoyl peroxide (  BENZACLIN) gel Apply topically 2 (two) times daily. (Patient not taking: Reported on 05/06/2016) 25 g 0 Completed Course at Unknown time  . cyclobenzaprine (FLEXERIL) 5 MG tablet Take 1 tablet (5 mg total) by mouth 3 (three) times daily. As muscle relaxer (Patient taking differently: Take 5 mg by mouth 3 (three) times daily as needed for muscle spasms. As muscle relaxer) 30 tablet 0 unknown  . doxycycline (VIBRAMYCIN) 100 MG capsule Take 1 capsule (100 mg total) by mouth 2 (two) times daily. (Patient not taking: Reported on 05/06/2016) 20 capsule 0 Completed Course at Unknown time  . Na Sulfate-K  Sulfate-Mg Sulf 17.5-3.13-1.6 GM/180ML SOLN Take as directed per procedure instructions (Patient not taking: Reported on 05/06/2016) 354 mL 0 Completed Course at Unknown time  . neomycin-polymyxin-hydrocortisone (CORTISPORIN) 3.5-10000-1 otic suspension Place 4 drops into the right ear 3 (three) times daily. (Patient not taking: Reported on 05/06/2016) 10 mL 0 Completed Course at Unknown time  . ondansetron (ZOFRAN ODT) 4 MG disintegrating tablet Take 1 tablet (4 mg total) by mouth every 8 (eight) hours as needed for nausea or vomiting. (Patient not taking: Reported on 05/06/2016) 15 tablet 0 Completed Course at Unknown time  . phenazopyridine (PYRIDIUM) 200 MG tablet Take 1 tablet (200 mg total) by mouth 3 (three) times daily. 6 tablet 0   . ranitidine (ZANTAC) 150 MG capsule Take 1 capsule (150 mg total) by mouth daily. 30 capsule 0     Review of Systems  Constitutional: Negative for chills and fever.  Gastrointestinal: Positive for abdominal pain. Negative for constipation, diarrhea, nausea and vomiting.  Genitourinary: Positive for pelvic pain. Negative for dysuria, frequency and urgency.   Physical Exam   Blood pressure 125/89, pulse 82, temperature 97.9 F (36.6 C), temperature source Oral, resp. rate 17, height 5\' 9"  (1.753 m), weight 94.2 kg (207 lb 9.6 oz).  Physical Exam  Nursing note and vitals reviewed. Constitutional: She is oriented to [place], place, and time. She appears well-developed and well-nourished. No distress.  HENT:  Head: Normocephalic.  Cardiovascular: Normal rate.   Respiratory: Effort normal.  GI: Soft. There is no tenderness. There is no rebound.  Genitourinary:  Genitourinary Comments: Enlarged inguinal nodes bilaterally Small patch of tinea corporis on the left side just above pubic hair line Vagina: small amount of white discharge Cervix: pink, smooth, no CMT Uterus: NSSC Adnexa: NT    Neurological: She is alert and oriented to [place], place, and time.   Skin: Skin is warm and dry.  Psychiatric: She has a normal mood and affect.   Results for orders placed or performed during the hospital encounter of 05/23/16 (from the past 24 hour(s))  Urinalysis, Routine w reflex microscopic (not at [facility])     Status: Abnormal   Collection Time: 05/23/16  6:39 PM  Result Value Ref Range   Color, Urine YELLOW YELLOW   APPearance CLEAR CLEAR   Specific Gravity, Urine 1.025 1.005 - 1.030   pH 5.5 5.0 - 8.0   Glucose, UA NEGATIVE NEGATIVE mg/dL   Hgb urine dipstick TRACE (A) NEGATIVE   Bilirubin Urine NEGATIVE NEGATIVE   Ketones, ur NEGATIVE NEGATIVE mg/dL   Protein, ur NEGATIVE NEGATIVE mg/dL   Nitrite NEGATIVE NEGATIVE   Leukocytes, UA NEGATIVE NEGATIVE  Urine microscopic-add on     Status: Abnormal   Collection Time: 05/23/16  6:39 PM  Result Value Ref Range   Squamous Epithelial / LPF 0-5 (A) NONE SEEN   WBC, UA 0-5 0 - 5 WBC/hpf  RBC / HPF 0-5 0 - 5 RBC/hpf   Bacteria, UA RARE (A) NONE SEEN  Pregnancy, urine POC     Status: None   Collection Time: 05/23/16  6:48 PM  Result Value Ref Range   Preg Test, Ur NEGATIVE NEGATIVE  Wet prep, genital     Status: Abnormal   Collection Time: 05/23/16  8:52 PM  Result Value Ref Range   Yeast Wet Prep HPF POC NONE SEEN NONE SEEN   Trich, Wet Prep NONE SEEN NONE SEEN   Clue Cells Wet Prep HPF POC NONE SEEN NONE SEEN   WBC, Wet Prep HPF POC MODERATE (A) NONE SEEN   Sperm NONE SEEN     MAU Course  Procedures  MDM   Assessment and Plan   1. Tinea corporis   2. Localized enlarged lymph nodes    DC home Comfort measures reviewed  RX: reviewed OTC medication to use Return to MAU as needed   Follow-up Information    [facility].   Specialty:  Family Medicine Contact information: [id number] [place] [phone]           Mathis Bud 05/23/2016, 8:39 PM

## 2016-05-23 NOTE — Discharge Instructions (Signed)
Body Ringworm Introduction Body ringworm is an infection of the skin that often causes a ring-shaped rash. Body ringworm can affect any part of your skin. It can spread easily to others. Body ringworm is also called tinea corporis. What are the causes? This condition is caused by funguses called dermatophytes. The condition develops when these funguses grow out of control on the skin. You can get this condition if you touch a [place] or animal that has it. You can also get it if you share clothing, bedding, towels, or any other object with an infected [place] or pet. What increases the risk? This condition is more likely to develop in:  Athletes who often make skin-to-skin contact with other athletes, such as wrestlers.  People who share equipment and mats.  People with a weakened immune system. What are the signs or symptoms? Symptoms of this condition include:  Itchy, raised red spots and bumps.  Red scaly patches.  A ring-shaped rash. The rash may have:  A clear center.  Scales or red bumps at its center.  Redness near its borders.  Dry and scaly skin on or around it. How is this diagnosed? This condition can usually be diagnosed with a skin exam. A skin scraping may be taken from the affected area and examined under a microscope to see if the fungus is present. How is this treated? This condition may be treated with:  An antifungal cream or ointment.  An antifungal shampoo.  Antifungal medicines. These may be prescribed if your ringworm is severe, keeps coming back, or lasts a long time. Follow these instructions at home:  Take over-the-counter and prescription medicines only as told by your health care provider.  If you were given an antifungal cream or ointment:  Use it as told by your health care provider.  Wash the infected area and dry it completely before applying the cream or ointment.  If you were given an antifungal shampoo:  Use it as told by your  health care provider.  Leave the shampoo on your body for 3-5 minutes before rinsing.  While you have a rash:  Wear loose clothing to stop clothes from rubbing and irritating it.  Wash or change your bed sheets every night.  If your pet has the same infection, take your pet to see a veterinarian. How is this prevented?  Practice good hygiene.  Wear sandals or shoes in public places and showers.  Do not share personal items with others.  Avoid touching red patches of skin on other people.  Avoid touching pets that have bald spots.  If you touch an animal that has a bald spot, wash your hands. Contact a health care provider if:  Your rash continues to spread after 7 days of treatment.  Your rash is not gone in 4 weeks.  The area around your rash gets red, warm, tender, and swollen. This information is not intended to replace advice given to you by your health care provider. Make sure you discuss any questions you have with your health care provider. Document Released: 06/08/2000 Document Revised: 11/17/2015 Document Reviewed: 04/07/2015  2017 Elsevier  

## 2016-05-23 NOTE — MAU Note (Signed)
Been having problems "down here" (lower abd) past several days.  About 5 days ago, she sneezed, and felt something pop - points to suprapubic area.  Since Sunday, woke up and there are knots - bilaterally in groin down to vagina and one at her rectum.   approx 3cm area in rt groin, dk red in color.states it itches, popped up after she shaved one day.   Last few night nights has woke up in a sweat.

## 2016-05-24 LAB — GC/CHLAMYDIA PROBE AMP (~~LOC~~) NOT AT ARMC
CHLAMYDIA, DNA PROBE: NEGATIVE
Neisseria Gonorrhea: NEGATIVE

## 2016-05-24 LAB — HIV ANTIBODY (ROUTINE TESTING W REFLEX): HIV SCREEN 4TH GENERATION: NONREACTIVE

## 2016-05-24 LAB — RPR: RPR: NONREACTIVE

## 2016-05-29 ENCOUNTER — Ambulatory Visit: Payer: Medicaid Other | Admitting: Family Medicine

## 2016-05-30 ENCOUNTER — Telehealth: Payer: Self-pay | Admitting: Internal Medicine

## 2016-05-30 NOTE — Telephone Encounter (Signed)
Pt states she ate chicken, steak and french fries around noon, pt states she thinks subconsciously she is afraid to have a colonoscopy, that she will not wake up. She requests an office visit as she states she is not having the abdominal problems she had been having now that she is not drinking as much alcohol. OV scheduled with Dr. Henrene Pastor 07/12/16 at 315 pm, pt informed to arrive at 3 pm on the 3rd floor.

## 2016-05-31 ENCOUNTER — Encounter: Payer: Medicaid Other | Admitting: Internal Medicine

## 2016-07-12 ENCOUNTER — Ambulatory Visit: Payer: Medicaid Other | Admitting: Internal Medicine

## 2016-08-23 ENCOUNTER — Ambulatory Visit: Payer: Medicaid Other | Admitting: Family Medicine

## 2016-08-24 ENCOUNTER — Encounter: Payer: Self-pay | Admitting: Student

## 2016-08-24 ENCOUNTER — Telehealth: Payer: Self-pay | Admitting: Family Medicine

## 2016-08-24 ENCOUNTER — Ambulatory Visit (INDEPENDENT_AMBULATORY_CARE_PROVIDER_SITE_OTHER): Payer: Medicaid Other | Admitting: Student

## 2016-08-24 VITALS — BP 138/82 | HR 86 | Temp 98.1°F | Ht 69.0 in | Wt 219.4 lb

## 2016-08-24 DIAGNOSIS — B354 Tinea corporis: Secondary | ICD-10-CM | POA: Diagnosis not present

## 2016-08-24 DIAGNOSIS — R21 Rash and other nonspecific skin eruption: Secondary | ICD-10-CM

## 2016-08-24 MED ORDER — TERBINAFINE HCL 250 MG PO TABS: 250.0000 mg | ORAL_TABLET | Freq: Every day | ORAL | 0 refills | Status: DC

## 2016-08-24 MED ORDER — TERBINAFINE HCL 1 % EX CREA: 1.0000 "application " | TOPICAL_CREAM | Freq: Two times a day (BID) | CUTANEOUS | 0 refills | Status: AC

## 2016-08-24 MED ORDER — TERBINAFINE HCL 1 % EX CREA: 1.0000 "application " | TOPICAL_CREAM | Freq: Two times a day (BID) | CUTANEOUS | 0 refills | Status: DC

## 2016-08-24 NOTE — Telephone Encounter (Signed)
Pt was given pills this morning and after reading up  them pt does not want to take them. Pt wants the cream instead. Pt uses [facility] on [address]. ep

## 2016-08-24 NOTE — Telephone Encounter (Signed)
I gave her prescription for Lamisil cream and she said she tried to for over two weeks and I gave her prescription for tablet. I told her about the side effects as well. Now she wants the cream? I am not sure if she tried it in the first place. I have sent the prescription for the cream. Thanks! Bretta Bang

## 2016-08-24 NOTE — Progress Notes (Signed)
Subjective:    Amber Villarreal is a 41 y.o. old female here skin rash  HPI Skin rash: for 6 weeks. Started on her low abdomen and back and spread up to her chest and upper back after steroid cream. Rash is itchy. She says it looks like a ring worm when her son had it. She also tried scabies cream but didn't help. I caused her another red spots of rash where she applied the scabies medicine. She doesn't remember the name. No one in the family has the same rash. Denies fever, recent illness, recent antibiotic use, new medicine, new cosmetic or detergents.   PMH/Problem List: has BIPOLAR DISORDER UNSPECIFIED; Obesity; Breast nodule; High risk sexual behavior; Alcohol abuse; Skin rash; Family history of malignant neoplasm of gastrointestinal tract; Melanocytic nevi of right upper limb, including shoulder; and Contraceptive management on her problem list.   has a past medical history of Anemia; Bipolar disorder, unspecified; cardiovascular stress test; and Opioid type dependence, unspecified.  FH:  Family History  Problem Relation Age of Onset  . Heart disease Mother 59    bypass  . Diabetes Mother   . Hypertension Mother   . Cancer Mother     vaginal cancer  . Congestive Heart Failure Mother     early 46's  . Stroke Mother   . Coronary artery disease Mother   . Hypertension Sister   . Coronary artery disease    . Coronary artery disease Maternal Aunt   . Colon cancer Maternal Uncle   . Prostate cancer Neg Hx     SH Social History  Substance Use Topics  . Smoking status: Former Smoker    Quit date: 06/25/2008  . Smokeless tobacco: Never Used  . Alcohol use 3.0 oz/week    3 Cans of beer, 2 Shots of liquor per week     Comment: Occasional    Review of Systems Review of systems negative except for pertinent positives and negatives in history of present illness above.     Objective:     Vitals:   08/24/16 0937  BP: 138/82  Pulse: 86  Temp: 98.1 F (36.7 C)  TempSrc: Oral  SpO2:  99%  Weight: 219 lb 6.4 oz (99.5 kg)  Height: 5\' 9"  (1.753 m)    Physical Exam GEN: appears well, no apparent distress. HEM: negative for cervical or periauricular lymphadenopathies CVS: RRR, nl S1&S2, no murmurs RESP: speaks in full sentence, no IWOB MSK: no focal tenderness or notable swelling SKIN: scaly circular rash over her abdomen and back. She also has erythematous maculopapular rash over her LUQ areas. See picture for more.      NEURO: alert and oiented appropriately, no gross defecits  PSYCH: euthymic mood with congruent affect    Assessment and Plan:  Skin rash Differential diagnosis are tinea corporis and nummular eczema. Worsening with steroid cream makes the latter less likely. Scrapped for KOH test but negative. However, this won't rule out completely. I gave her prescription for Lamisil cream. However, she says she has also tried  Lamisil cream for one month without improvement. She reports changing clothes and bed linens daily while using Lamisil cream.   Gave prescription for terbinafine 250 mg daily for two weeks. She has normal LFT recently. If no improvement with this, will refer to derm clinic.  Recommended washing her clothes and bed linens with warm water and detergents and changing her clothes daily.     Addendum:  Received this message after she left the office.  Pt was given pills this morning and after reading up them pt does not want to take them. Pt wants the cream instead. Pt uses [facility] on [address]. Ep  I sent a prescription for Lamisil cream to her pharmacy. No Follow-up on file.   Mercy Riding, MD 08/24/16 Pager: [phone]

## 2016-08-24 NOTE — Addendum Note (Signed)
Addended by: Wendee Beavers T on: 08/24/2016 06:31 PM   Modules accepted: Orders, Level of Service

## 2016-08-24 NOTE — Patient Instructions (Signed)
It was great seeing you today! We have addressed the following issues today 1. Skin rash: this is likely due to ringworm infection. I have sent a prescription for antifungal cream to your pharmacy. Apply a thin layer of this cream twice a day to rash. Wash you body with soap and water before applying. Wash your clothes including bed sheets with a detergent and warm water. Put on clean clothes after each application.   If we did any lab work today, and the results require attention, either me or my nurse will get in touch with you. If everything is normal, you will get a letter in mail and a message via . If you don't hear from [place] in two weeks, please give [place] a call. Otherwise, we look forward to seeing you again at your next visit. If you have any questions or concerns before then, please call the clinic at [phone].  Please bring all your medications to every doctors visit  Sign up for My Chart to have easy access to your labs results, and communication with your Primary care physician.    Please check-out at the front desk before leaving the clinic.    Take Care,   Dr. Cyndia Skeeters   Body Ringworm Body ringworm is an infection of the skin that often causes a ring-shaped rash. Body ringworm can affect any part of your skin. It can spread easily to others. Body ringworm is also called tinea corporis. What are the causes? This condition is caused by funguses called dermatophytes. The condition develops when these funguses grow out of control on the skin. You can get this condition if you touch a Gaida or animal that has it. You can also get it if you share clothing, bedding, towels, or any other object with an infected Hirschmann or pet. What increases the risk? This condition is more likely to develop in:  Athletes who often make skin-to-skin contact with other athletes, such as wrestlers.  People who share equipment and mats.  People with a weakened immune system. What are the signs or  symptoms? Symptoms of this condition include:  Itchy, raised red spots and bumps.  Red scaly patches.  A ring-shaped rash. The rash may have:  A clear center.  Scales or red bumps at its center.  Redness near its borders.  Dry and scaly skin on or around it. How is this diagnosed? This condition can usually be diagnosed with a skin exam. A skin scraping may be taken from the affected area and examined under a microscope to see if the fungus is present. How is this treated? This condition may be treated with:  An antifungal cream or ointment.  An antifungal shampoo.  Antifungal medicines. These may be prescribed if your ringworm is severe, keeps coming back, or lasts a long time. Follow these instructions at home:  Take over-the-counter and prescription medicines only as told by your health care provider.  If you were given an antifungal cream or ointment:  Use it as told by your health care provider.  Wash the infected area and dry it completely before applying the cream or ointment.  If you were given an antifungal shampoo:  Use it as told by your health care provider.  Leave the shampoo on your body for 3-5 minutes before rinsing.  While you have a rash:  Wear loose clothing to stop clothes from rubbing and irritating it.  Wash or change your bed sheets every night.  If your pet has the  same infection, take your pet to see a Animal nutritionist. How is this prevented?  Practice good hygiene.  Wear sandals or shoes in public places and showers.  Do not share personal items with others.  Avoid touching red patches of skin on other people.  Avoid touching pets that have bald spots.  If you touch an animal that has a bald spot, wash your hands. Contact a health care provider if:  Your rash continues to spread after 7 days of treatment.  Your rash is not gone in 4 weeks.  The area around your rash gets red, warm, tender, and swollen. This information is not  intended to replace advice given to you by your health care provider. Make sure you discuss any questions you have with your health care provider. Document Released: 06/08/2000 Document Revised: 11/17/2015 Document Reviewed: 04/07/2015 Elsevier Interactive Patient Education  2017 [employer].

## 2016-08-24 NOTE — Assessment & Plan Note (Addendum)
Differential diagnosis are tinea corporis and nummular eczema. Worsening with steroid cream makes the latter less likely. Scrapped for KOH test but negative. However, this won't rule out completely. I gave her prescription for Lamisil cream. However, she says she has also tried  Lamisil cream for one month without improvement. She reports changing clothes and bed linens daily while using Lamisil cream.   Gave prescription for terbinafine 250 mg daily for two weeks. She has normal LFT recently. If no improvement with this, will refer to derm clinic.  Recommended washing her clothes and bed linens with warm water and detergents and changing her clothes daily.

## 2016-08-28 NOTE — Telephone Encounter (Signed)
Pt informed. Sharon T Saunders, CMA  

## 2016-09-10 ENCOUNTER — Telehealth: Payer: Self-pay | Admitting: Family Medicine

## 2016-09-10 NOTE — Telephone Encounter (Signed)
Called to remind patient about scheduled appointment and to advise to bring medications and to arrive early for check in. Patient complains of worsening reaction and itching, possibly due to [facility] use.

## 2016-09-11 ENCOUNTER — Ambulatory Visit (INDEPENDENT_AMBULATORY_CARE_PROVIDER_SITE_OTHER): Payer: Medicaid Other | Admitting: Family Medicine

## 2016-09-11 ENCOUNTER — Encounter: Payer: Self-pay | Admitting: Family Medicine

## 2016-09-11 VITALS — BP 122/80 | HR 78 | Temp 98.3°F | Wt 221.4 lb

## 2016-09-11 DIAGNOSIS — R51 Headache: Secondary | ICD-10-CM | POA: Diagnosis present

## 2016-09-11 DIAGNOSIS — R519 Headache, unspecified: Secondary | ICD-10-CM

## 2016-09-11 DIAGNOSIS — R21 Rash and other nonspecific skin eruption: Secondary | ICD-10-CM | POA: Diagnosis not present

## 2016-09-11 MED ORDER — ALBUTEROL SULFATE HFA 108 (90 BASE) MCG/ACT IN AERS: 2.0000 | INHALATION_SPRAY | Freq: Four times a day (QID) | RESPIRATORY_TRACT | 0 refills | Status: DC | PRN

## 2016-09-11 NOTE — Patient Instructions (Addendum)
It was a pleasure seeing you today in our clinic. Today we discussed your rash and shortness of breath. Here is the treatment plan we have discussed and agreed upon together:   - I've placed an order for albuterol. Take 1-2 puffs every 6 hours as needed for shortness of breath. If this provides significant relief and I would strongly recommend making a follow-up appointment in our office with Dr. Valentina Lucks for "a PFT." - I placed a referral to have a sleep study performed. You will be contacted later this week or next week to schedule this appointment. - I have obtained labs which I will mail to you the results next week. - I would like to have a follow-up with your primary care provider, Dr. Gerlean Ren, in about 4 weeks.

## 2016-09-11 NOTE — Assessment & Plan Note (Signed)
Stable: Patient is complaining of a skin rash on her abdomen and back. Etiology currently unknown. Patient endorsed use of Lamisil without improvement. She states that she is not using bleach which seems to have improved her symptoms, per patient. To me, I do not feel patient has used an appropriate amount of antifungal cream in order to improve her symptoms. I feel patient is likely EXTREMELY noncompliant and is very easily manipulated by outside sources of information. - Encouraged continued use of Lamisil >> patient did not want to continue this. - If symptoms persist or worsen would consider possible rheumatologic labs; however I still feel strongly that due to patient's preferences we have not properly ruled out fungal etiology. - Advised to follow-up with PCP in 4 weeks if rash worsens or persists.

## 2016-09-11 NOTE — Assessment & Plan Note (Addendum)
(  Difficult to fully assess/understand complaints due to patient's tangential approach to communication) Patient is here complaining of morning headaches and other vague pain. Etiology unknown at this time however she endorsed a significant amount of symptoms that are consistent with sleep apnea. Patient also has long history of smoking and did endorse some occasional shortness of breath; patient may be experiencing initial signs of COPD. - Obtain labs: CBC, CMP, TSH - Albuterol inhaler as needed for shortness of breath.  - Discussed setting up appointment with Dr. Valentina Lucks for PFTs if she experiences significant improved symptoms with albuterol. - Refer to sleep medicine for sleep study. - Follow-up with PCP

## 2016-09-11 NOTE — Progress Notes (Signed)
HPI  CC: RASH First seen some time ago. Located along her back. States that it is very pruritic. Had tried a steroid cream which caused this to worsen. Then tried an antifungal which she states also made it worse. Recently started using bleach which seems to have made it better.  Had rash for ~10 weeks. Location: abdomen and back Medications tried: lamisil, bleach  Similar rash in past: no New medications or antibiotics: lamisil Tick, Insect or new pet exposure: no Recent travel: no New detergent or soap: no Immunocompromised: no  Symptoms Itching: yes Pain over rash: no Feeling ill all over: no Fever: no Mouth sores: no Face or tongue swelling: no Trouble breathing: no Joint swelling or pain: no   A.m. headaches: Patient is also complaining of headaches in the morning. She describes these headaches as compressive as if her brain was "getting starved from air". Headaches are worse in the middle the night and first thing in the morning. She states that her child tells her that she snores loudly at night. She does not feel well rested when first waking up in the morning. Patient often wakes up in the middle the night feeling significantly short of breath. She states that she knows "something is wrong".  Patient has been smoking cigarettes, 2/3 packs a day, over the past 30 years. Denies any vision changes. No recent weakness, numbness, nausea, vomiting, diarrhea, or fatigue. No recent injury or trauma.  Review of Systems See HPI for ROS.   CC, SH/smoking status, and VS noted  Objective: BP 122/80   Pulse 78   Temp 98.3 F (36.8 C) (Oral)   Wt 221 lb 6.4 oz (100.4 kg)   SpO2 98%   BMI 32.70 kg/m  Gen: NAD, alert, cooperative HEENT: NCAT, EOMI, PERRL, MMM CV: RRR, no murmur Resp: CTAB, no wheezes, non-labored Integument: Circular, thickened rash over her abdomen (x2) and back (x3). Appears unchanged from photo noted from previous office visit. Neuro: Alert and  oriented, Speech clear, No gross deficits, some tangential tendencies.   Assessment and plan:  Skin rash Stable: Patient is complaining of a skin rash on her abdomen and back. Etiology currently unknown. Patient endorsed use of Lamisil without improvement. She states that she is not using bleach which seems to have improved her symptoms, per patient. To me, I do not feel patient has used an appropriate amount of antifungal cream in order to improve her symptoms. I feel patient is likely EXTREMELY noncompliant and is very easily manipulated by outside sources of information. - Encouraged continued use of Lamisil >> patient did not want to continue this. - If symptoms persist or worsen would consider possible rheumatologic labs; however I still feel strongly that due to patient's preferences we have not properly ruled out fungal etiology. - Advised to follow-up with PCP in 4 weeks if rash worsens or persists.  Morning headache (Difficult to fully assess/understand complaints due to patient's tangential approach to communication) Patient is here complaining of morning headaches and other vague pain. Etiology unknown at this time however she endorsed a significant amount of symptoms that are consistent with sleep apnea. Patient also has long history of smoking and did endorse some occasional shortness of breath; patient may be experiencing initial signs of COPD. - Obtain labs: CBC, CMP, TSH - Albuterol inhaler as needed for shortness of breath.  - Discussed setting up appointment with Dr. Valentina Lucks for PFTs if she experiences significant improved symptoms with albuterol. - Refer to sleep medicine  for sleep study. - Follow-up with PCP   Orders Placed This Encounter  Procedures  . Comprehensive metabolic panel    Order Specific Question:   Has the patient fasted?    Answer:   No  . CBC  . TSH  . Ambulatory referral to Sleep Studies    Referral Priority:   Routine    Referral Type:   Consultation     Referral Reason:   Specialty Services Required    Number of Visits Requested:   1    Meds ordered this encounter  Medications  . albuterol (PROVENTIL HFA;VENTOLIN HFA) 108 (90 Base) MCG/ACT inhaler    Sig: Inhale 2 puffs into the lungs every 6 (six) hours as needed for wheezing or shortness of breath.    Dispense:  1 Inhaler    Refill:  0     Elberta Leatherwood, MD,MS,  PGY3 09/11/2016 6:49 PM

## 2016-09-12 LAB — COMPREHENSIVE METABOLIC PANEL
ALT: 21 IU/L (ref 0–32)
AST: 15 IU/L (ref 0–40)
Albumin/Globulin Ratio: 1.4 (ref 1.2–2.2)
Albumin: 4.2 g/dL (ref 3.5–5.5)
Alkaline Phosphatase: 52 IU/L (ref 39–117)
BILIRUBIN TOTAL: 0.3 mg/dL (ref 0.0–1.2)
BUN/Creatinine Ratio: 14 (ref 9–23)
BUN: 14 mg/dL (ref 6–24)
CALCIUM: 9.5 mg/dL (ref 8.7–10.2)
CHLORIDE: 100 mmol/L (ref 96–106)
CO2: 24 mmol/L (ref 18–29)
Creatinine, Ser: 1.03 mg/dL — ABNORMAL HIGH (ref 0.57–1.00)
GFR calc Af Amer: 77 mL/min/{1.73_m2} (ref >59)
GFR calc non Af Amer: 67 mL/min/{1.73_m2} (ref >59)
GLUCOSE: 97 mg/dL (ref 65–99)
Globulin, Total: 2.9 g/dL (ref 1.5–4.5)
Potassium: 4.3 mmol/L (ref 3.5–5.2)
Sodium: 139 mmol/L (ref 134–144)
TOTAL PROTEIN: 7.1 g/dL (ref 6.0–8.5)

## 2016-09-12 LAB — CBC
HEMATOCRIT: 42.2 % (ref 34.0–46.6)
HEMOGLOBIN: 13.9 g/dL (ref 11.1–15.9)
MCH: 32 pg (ref 26.6–33.0)
MCHC: 32.9 g/dL (ref 31.5–35.7)
MCV: 97 fL (ref 79–97)
Platelets: 338 10*3/uL (ref 150–379)
RBC: 4.35 x10E6/uL (ref 3.77–5.28)
RDW: 13.2 % (ref 12.3–15.4)
WBC: 8.9 10*3/uL (ref 3.4–10.8)

## 2016-09-12 LAB — TSH: TSH: 1.11 u[IU]/mL (ref 0.450–4.500)

## 2016-09-18 ENCOUNTER — Encounter: Payer: Self-pay | Admitting: Family Medicine

## 2016-09-19 ENCOUNTER — Telehealth: Payer: Self-pay | Admitting: Family Medicine

## 2016-09-19 MED ORDER — PANTOPRAZOLE SODIUM 40 MG PO TBEC: 40.0000 mg | DELAYED_RELEASE_TABLET | Freq: Every day | ORAL | 2 refills | Status: DC

## 2016-09-19 NOTE — Telephone Encounter (Signed)
I received an email from patient to discuss previous visits labs.  Interpretation of labs: Labs were relatively unremarkable no specific areas of concern, or evidence of abnormalities could be causing some of her discomfort.  After discussion with patient I learned that patient had found in association with some of her abdominal pain/discomfort. She states that she had noticed a worsening in her pain whenever she took "Goody powders" or Excedrin. She had improvement of pain when she avoided these as well as caffeine and when she increased her water intake.  My concern is that patient may be experiencing some gastritis +/- gastric/duodenal ulcers.   I discussed my concern with patient. At that time I recommended a short trial of a PPI.  After discussion of this medication patient was very adamant not to start this medicine as she has "done her research". I informed her that I would not force her to take this medication but I would not prescribe any medication that I did not feel was safe for any patient at that particular time.   After some discussion patient stated that she would try to resolve the symptoms on her own, but stated that she would like me to send in the prescription for the PPI and she would try this medication "for a day or 2".  Patient had no further questions at this time. She is very pleasant throughout the entire telephone call.  Elberta Leatherwood, MD,MS,  PGY3 09/19/2016 7:17 PM

## 2016-11-13 ENCOUNTER — Encounter: Payer: Self-pay | Admitting: Family Medicine

## 2016-11-13 ENCOUNTER — Telehealth: Payer: Self-pay | Admitting: Family Medicine

## 2016-11-14 ENCOUNTER — Ambulatory Visit (INDEPENDENT_AMBULATORY_CARE_PROVIDER_SITE_OTHER): Payer: Medicaid Other | Admitting: Internal Medicine

## 2016-11-14 ENCOUNTER — Encounter: Payer: Self-pay | Admitting: Internal Medicine

## 2016-11-14 VITALS — BP 118/80 | HR 62 | Temp 98.2°F | Wt 215.0 lb

## 2016-11-14 DIAGNOSIS — Z3042 Encounter for surveillance of injectable contraceptive: Secondary | ICD-10-CM

## 2016-11-14 DIAGNOSIS — H01001 Unspecified blepharitis right upper eyelid: Secondary | ICD-10-CM | POA: Insufficient documentation

## 2016-11-14 DIAGNOSIS — Z3009 Encounter for other general counseling and advice on contraception: Secondary | ICD-10-CM

## 2016-11-14 LAB — POCT URINE PREGNANCY: Preg Test, Ur: NEGATIVE

## 2016-11-14 MED ORDER — POLYMYXIN B-TRIMETHOPRIM 10000-0.1 UNIT/ML-% OP SOLN: 1.0000 [drp] | Freq: Four times a day (QID) | OPHTHALMIC | 0 refills | Status: DC

## 2016-11-14 MED ORDER — MEDROXYPROGESTERONE ACETATE 150 MG/ML IM SUSP
150.0000 mg | Freq: Once | INTRAMUSCULAR | Status: AC
  Administered 2016-11-14: 150 mg via INTRAMUSCULAR

## 2016-11-14 NOTE — Progress Notes (Signed)
   [facility] Phone: [phone]  Subjective:  Amber Villarreal is a 41 year old female presenting to same day clinic with right eye swelling. Two weeks ago, she put colored contacts in her eyes. She took them out after a few hours because she noticed that her right eye felt "gritty" and was red. She scratched her eye a lot that day and then noticed that her upper eyelid became swollen. She has tried putting ice and heat on the eyelid, they that didn't help. She denies any eye pain. She is no longer having eye redness. She states her eye is occasionally crusty in the morning. She notices a film over her eye first thing in the morning, but denies any other changes in vision.  Patient also states she would like to restart the depo shot. She has been on this in the past, but has missed the last couple of months.  ROS: See HPI for pertinent positives and negatives  Past Medical History- bipolar disorder, alcohol abuse, obesity  Family history reviewed for today's visit. No changes.  Social history- patient is a former smoker  Objective: BP 118/80   Pulse 62   Temp 98.2 F (36.8 C) (Oral)   Wt 215 lb (97.5 kg)   LMP 11/10/2016   BMI 31.75 kg/m  Gen: NAD, alert, cooperative with exam HEENT: Right upper eyelid mildly erythematous in comparison to the left. No periorbital erythema. No conjunctival injection. EOMI. PERRLA. No pain with eye movement. No styes.   Visual acuity: 20/20 bilaterally  Assessment/Plan: R Blepharitis: Patient with right upper eyelid swelling for the last 2 weeks after using colored contacts for a few hours. Her eye was red and "felt gritty afterwards". No longer having sensation of grittiness and no longer having redness. No concern for orbital or periorbital cellulitis, as patient does not have pain with EOMI and there is no redness surrounding the eye. Patient declined fluorescin exam. Visual acuity normal bilaterally. - Prescribed Polytrim eye  drops - Return precautions discussed - Follow-up if no improvement - Precepted with Dr. Ardelia Mems  Birth control: - Urine pregnancy negative - Depo shot given in clinic today   Hyman Bible, MD PGY-2

## 2016-11-14 NOTE — Patient Instructions (Signed)
It was so nice to meet you!  I have sent in some antibiotic eye drops into your pharmacy. Please use one drop 4 times per day until your symptoms improve.  If you develop any eye pain or blurred vision, please come back to see [place]!  -Dr. Brett Albino

## 2016-11-14 NOTE — Telephone Encounter (Signed)
Saw this in the [facility] but can't track down any details.  Kennyth Lose or Sonia Baller - have you been in contact with this [facility]?

## 2016-11-14 NOTE — Assessment & Plan Note (Signed)
-   Urine pregnancy negative - Depo shot given in clinic today

## 2016-11-14 NOTE — Assessment & Plan Note (Signed)
Patient with right upper eyelid swelling for the last 2 weeks after using colored contacts for a few hours. Her eye was red and "felt gritty afterwards". No longer having sensation of grittiness and no longer having redness. No concern for orbital or periorbital cellulitis, as patient does not have pain with EOMI and there is no redness surrounding the eye. Patient declined fluorescin exam. Visual acuity normal bilaterally. - Prescribed Polytrim eye drops - Return precautions discussed - Follow-up if no improvement - Precepted with Dr. Ardelia Mems

## 2016-11-22 ENCOUNTER — Ambulatory Visit (INDEPENDENT_AMBULATORY_CARE_PROVIDER_SITE_OTHER): Payer: Medicaid Other | Admitting: Family Medicine

## 2016-11-22 ENCOUNTER — Encounter: Payer: Self-pay | Admitting: Family Medicine

## 2016-11-22 VITALS — BP 128/76 | HR 68 | Temp 98.3°F | Ht 69.0 in | Wt 214.6 lb

## 2016-11-22 DIAGNOSIS — H0011 Chalazion right upper eyelid: Secondary | ICD-10-CM

## 2016-11-22 DIAGNOSIS — L03213 Periorbital cellulitis: Secondary | ICD-10-CM | POA: Diagnosis not present

## 2016-11-22 MED ORDER — CLINDAMYCIN HCL 300 MG PO CAPS: 300.0000 mg | ORAL_CAPSULE | Freq: Three times a day (TID) | ORAL | 0 refills | Status: DC

## 2016-11-22 NOTE — Patient Instructions (Addendum)
I want you to use warm compresses applied to right eye several times daily (at least 5 times daily).  You have been prescribed Clindamycin.  Take this every 8 hours.  Common side effects include upset stomach/ diarrhea.  Eat yogurt/ use an over the counter probiotic while on this medication to reduce side effects.  If you develop severe diarrhea, please inform your doctor.  If you see worsening swelling, develop pain, develop visual changes such as blurred vision, double vision, light sensitivity or pain with moving your eyeballs around, seek immediate medical attention.    Follow up on Monday as scheduled for recheck.   Preseptal Cellulitis, Adult Preseptal cellulitis-also called periorbital cellulitis-is an infection that can affect your eyelid and the soft tissues or skin that surround your eye. The infection may also affect the structures that produce and drain your tears. It does not affect your eye itself. What are the causes? This condition may be caused by:  Bacterial infection.  Long-term (chronic) sinus infections.  An object (foreign body) that is stuck behind the eye.  An injury that: ? Goes through the eyelid tissues. ? Causes an infection, such as an insect sting.  Fracture of the bone around the eye.  Infections that have spread from the eyelid or other structures around the eye.  Bite wounds.  Inflammation or infection of the lining membranes of the brain (meningitis).  An infection in the blood (septicemia).  Dental infection (abscess).  Viral infection. This is rare.  What increases the risk? Risk factors for preseptal cellulitis include:  Participating in activities that increase your risk of trauma to the face or head, such as boxing or high-speed activities.  Having a weakened defense system (immune system).  Medical conditions, such as nasal polyps, that increase your risk for frequent or recurrent sinus infections.  Not receiving regular dental  care.  What are the signs or symptoms? Symptoms of this condition usually come on suddenly. Symptoms may include:  Red, hot, and swollen eyelids.  Fever.  Difficulty opening your eye.  Eye pain.  How is this diagnosed? This condition may be diagnosed by an eye exam. You may also have tests, such as:  Blood tests.  CT scan.  MRI.  Spinal tap (lumbar puncture). This is a procedure that involves removing and examining a small amount of the fluid that surrounds the brain and spinal cord. This checks for meningitis.  How is this treated? Treatment for this condition will include antibiotic medicines. These may be given by mouth (orally), through an IV, or as a shot. Your health care provider may also recommend nasal decongestants to reduce swelling. Follow these instructions at home:  Take your antibiotic medicine as directed by your health care provider. Finish all of it even if you start to feel better.  Take medicines only as directed by your health care provider.  Drink enough fluid to keep your urine clear or pale yellow.  Do not use any tobacco products, including cigarettes, chewing tobacco, or electronic cigarettes. If you need help quitting, ask your health care provider.  Keep all follow-up visits as directed by your health care provider. These include any visits with an eye specialist (ophthalmologist) or dentist. Contact a health care provider if:  You have a fever.  Your eyelids become more red, warm, or swollen.  You have new symptoms.  Your symptoms do not get better with treatment. Get help right away if:  You develop double vision, or your vision becomes blurred  or worsens in any way.  You have trouble moving your eyes.  Your eye looks like it is sticking out or bulging out (proptosis).  You develop a severe headache, severe neck pain, or neck stiffness.  You develop repeated vomiting. This information is not intended to replace advice given to you  by your health care provider. Make sure you discuss any questions you have with your health care provider. Document Released: 07/14/2010 Document Revised: 11/17/2015 Document Reviewed: 06/07/2014 Elsevier Interactive Patient Education  [employer].

## 2016-11-22 NOTE — Progress Notes (Signed)
   Subjective: CC: right eye swelling. [id number] Piercey is a 41 y.o. female presenting to clinic today for same day appointment. PCP: Lorna Few, DO Concerns today include:  1. Right eye swelling Patient seen 5/23 for right eye gritty sensation.  Diagnosed with blepharitis.  She was prescribed Polytrim eye gtts empirically.  She reports that symptoms improved slightly the first day of eye gtts.  However since then, upper eyelid has become more swollen.  She denies ocular pain but notes that eye seems warmer on that side.  She reports subjective fevers and chills at home.  Tolerating PO normally.  No visual disturbance, pain with eye movement, conjunctival injection, photophobia.  No nausea, vomiting.  No contact use since initial presentation.  Allergies  Allergen Reactions  . Metronidazole Other (See Comments)    Heart hurt  . Penicillins     From when younger Has patient had a PCN reaction causing immediate rash, facial/tongue/throat swelling, SOB or lightheadedness with hypotension: No Has patient had a PCN reaction causing severe rash involving mucus membranes or skin necrosis: No Has patient had a PCN reaction that required hospitalization No Has patient had a PCN reaction occurring within the last 10 years: No If all of the above answers are "NO", then may proceed with Cephalosporin use.     Social Hx reviewed. MedHx, current medications and allergies reviewed.  Please see EMR. ROS: Per HPI  Objective: Office vital signs reviewed. BP 128/76   Pulse 68   Temp 98.3 F (36.8 C) (Oral)   Ht 5\' 9"  (1.753 m)   Wt 214 lb 9.6 oz (97.3 kg)   LMP 11/10/2016   SpO2 98%   BMI 31.69 kg/m   Physical Examination:  General: Awake, alert, well nourished, No acute distress HEENT: moderately swollen right upper eyelid, obscuring about 1/4 of the iris.  Patient able to open bilateral eyelids independently.   Eye:  PERRL, EOMI, no pain with EOM testing, sclera white, no  conjunctival/ ocular discharge, no foreign bodies identified, +50mm yellow lesion on the mucosal surface of the medial aspect of the upper eyelid consistent with chalazion. No drainage seen. No bleeding. Mildly increased warmth of right eyelid compared to left. No facial TTP, no periorbital TTP or swelling.  No periorbital erythema seen.  Visual acuity test:  Right 20/20 Left 20/20 Both 20/20  Assessment/ Plan: 41 y.o. female   1. Preseptal cellulitis of right upper eyelid.  Given amount of swelling and reported subjective fever at home, will empirically treat for cellulitis.  Her exam was remarkable for a chalazion on the right medial upper eyelid.  Visual acuity 20/20.  No red flag signs.  Allergic to PCNs so Clindamycin prescribed. - clindamycin (CLEOCIN) 300 MG capsule; Take 1 capsule (300 mg total) by mouth every 8 (eight) hours. x10 days  Dispense: 30 capsule; Refill: 0 - Recommend use of probiotic - Strict return precautions and reasons to seek emergent care in ED discussed and reinforced in AVS.   - Patient scheduled for f/u with Dr Cyndia Skeeters for recheck on Monday during today's appt.  2. Chalazion of right upper eyelid - Warm compresses applied to eye 5x daily recommended - Ok to use lubricating drops if needed.  Janora Norlander, DO PGY-3, [facility]

## 2016-11-26 ENCOUNTER — Encounter: Payer: Self-pay | Admitting: Student

## 2016-11-26 ENCOUNTER — Ambulatory Visit (INDEPENDENT_AMBULATORY_CARE_PROVIDER_SITE_OTHER): Payer: Medicaid Other | Admitting: Student

## 2016-11-26 VITALS — BP 128/92 | HR 82 | Temp 98.2°F | Ht 69.0 in | Wt 215.2 lb

## 2016-11-26 DIAGNOSIS — H578 Other specified disorders of eye and adnexa: Secondary | ICD-10-CM | POA: Diagnosis not present

## 2016-11-26 DIAGNOSIS — H02843 Edema of right eye, unspecified eyelid: Secondary | ICD-10-CM

## 2016-11-26 DIAGNOSIS — R6889 Other general symptoms and signs: Secondary | ICD-10-CM

## 2016-11-26 MED ORDER — OLOPATADINE HCL 0.2 % OP SOLN: 1.0000 [drp] | Freq: Two times a day (BID) | OPHTHALMIC | 0 refills | Status: DC

## 2016-11-26 NOTE — Patient Instructions (Signed)
It was great seeing you today! We have addressed the following issues today 1. Eye swelling: Continue taking the antibiotic until you finish the whole course. I have also sent a prescription for an eye drop to the pharmacy. Continue warm compresses as well. Please let us know if you have pain or vision changes or other symptoms concerning to you.   If we did any lab work today, and the results require attention, either me or my nurse will get in touch with you. If everything is normal, you will get a letter in mail and a message via . If you don't hear from [place] in two weeks, please give [place] a call. Otherwise, we look forward to seeing you again at your next visit. If you have any questions or concerns before then, please call the clinic at [phone].  Please bring all your medications to every doctors visit  Sign up for My Chart to have easy access to your labs results, and communication with your Primary care physician.    Please check-out at the front desk before leaving the clinic.    Take Care,   Dr. Cyndia Skeeters

## 2016-11-26 NOTE — Progress Notes (Signed)
Subjective:    Amber Villarreal is a 41 y.o. old female here right eye swelling  HPI Right eye swelling: used eye lash and contact lens on 10/29/2016. Has used eye lash in the past and didn't have issues with it. The contact lens was new. She used it to change the color of her eyes. The swelling has gotten worse since then. She presented to clinic about 2 weeks ago and was given Polytrim eyedrops which didn't help. She came back to clinic a week later and she was put on clindamycin for presumed preseptal cellulitis. She said this briefly helped for a day but the swelling recurred. Denies trauma, pain with or without eye movement, vision changes, fever or burning. Itches a little bit. Some discharge in the morning when she wakes up.   PMH/Problem List: has BIPOLAR DISORDER UNSPECIFIED; Obesity; Breast nodule; High risk sexual behavior; Alcohol abuse; Skin rash; Family history of malignant neoplasm of gastrointestinal tract; Melanocytic nevi of right upper limb, including shoulder; Contraceptive management; Morning headache; Blepharitis of right upper eyelid; and Swelling of eyelid, right on her problem list.   has a past medical history of Anemia; Bipolar disorder, unspecified ([place]); cardiovascular stress test; and Opioid type dependence, unspecified.  FH:  Family History  Problem Relation Age of Onset  . Heart disease Mother 69       bypass  . Diabetes Mother   . Hypertension Mother   . Cancer Mother        vaginal cancer  . Congestive Heart Failure Mother        early 2's  . Stroke Mother   . Coronary artery disease Mother   . Hypertension Sister   . Coronary artery disease Unknown   . Coronary artery disease Maternal Aunt   . Colon cancer Maternal Uncle   . Prostate cancer Neg Hx     SH Social History  Substance Use Topics  . Smoking status: Former Smoker    Quit date: 06/25/2008  . Smokeless tobacco: Never Used  . Alcohol use 3.0 oz/week    3 Cans of beer, 2 Shots of liquor per week     Comment: Occasional    Review of Systems Review of systems negative except for pertinent positives and negatives in history of present illness above.     Objective:     Vitals:   11/26/16 1626  BP: (!) 128/92  Pulse: 82  Temp: 98.2 F (36.8 C)  TempSrc: Oral  Weight: 215 lb 3.2 oz (97.6 kg)  Height: 5\' 9"  (1.753 m)    Physical Exam GEN: appears well, no apparent distress. Eyes: no tearing, no discharge, PERRL, normal direct and consensual pupillary reflex, no strabismus, EOMI, no proptosis, right upper eyelid swollen but no erythema, increased warmth to touch, no underlying fluid loculation or apparent pus, no bulbar or palberbral conjunctival injection, no corneal clouding. No photophobia.     Nares: no rhinorrhea, congestion or erythema CVS: RRR, nl S1&S2, no murmurs RESP: speaks in full sentence, no IWOB SKIN: no apparent skin lesion NEURO: alert and oiented appropriately, no gross defecits  PSYCH: euthymic mood with congruent affect    Assessment and Plan:  Swelling of eyelid, right Unclear etiology. Doesn't appear infectious. No red flags such as pain or vision change. She tried antibiotic eye drop and oral clindamycin without improvement. She tried warm compress as well. Dacroadenitis is a possibility but doesn't look inflamed.  She reports mild itching. I wonder if this is due to lacrimal secretary duct  blockage or allergy to contact lens or eye lash she used. However, I expect the later to be bilateral.   -Recommend continuing clindamycin until she completes the course -Try cold compress to see if this helps with ?inflammation -Will try Pataday eye drop for possible allergy/itching -Discussed return precautions as well   Return if symptoms worsen or fail to improve.  Mercy Riding, MD 11/27/16 Pager: [phone]  Discussed case with Dr. Ree Kida

## 2016-11-27 ENCOUNTER — Telehealth: Payer: Self-pay | Admitting: Family Medicine

## 2016-11-27 DIAGNOSIS — H02843 Edema of right eye, unspecified eyelid: Secondary | ICD-10-CM | POA: Insufficient documentation

## 2016-11-27 NOTE — Assessment & Plan Note (Addendum)
Unclear etiology. Doesn't appear infectious. No red flags such as pain or vision change. She tried antibiotic eye drop and oral clindamycin without improvement. She tried warm compress as well. Dacroadenitis is a possibility but doesn't look inflamed.  She reports mild itching. I wonder if this is due to lacrimal secretary duct blockage or allergy to contact lens or eye lash she used. However, I expect the later to be bilateral.   -Recommend continuing clindamycin until she completes the course -Try cold compress to see if this helps with ?inflammation -Will try Pataday eye drop for possible allergy/itching -Discussed return precautions as well

## 2016-11-27 NOTE — Telephone Encounter (Signed)
Routing to Dr. Cyndia Skeeters, who last saw [name] for this.

## 2016-11-27 NOTE — Telephone Encounter (Signed)
Pt wants a referral for an eye doctor since no one knows what to do for it and now she is having issues with the other eye. Pt wants referral asap. Pt wants someone to call her as soon as referral is put in. ep

## 2016-11-28 NOTE — Progress Notes (Signed)
Called patient to follow up on a eye swelling. She states that it is still swollen. She denies pain or vision changes. She started using the Pataday eye drop yesterday. She is also taking clindamycin. She likes to know is she can get referral to an eye doctor. I advised patient that she can call any Medicaid accepting eye doctors. Patient voiced understanding and agrees to do as advised.

## 2016-12-12 ENCOUNTER — Ambulatory Visit (INDEPENDENT_AMBULATORY_CARE_PROVIDER_SITE_OTHER): Payer: Medicaid Other | Admitting: Family Medicine

## 2016-12-12 ENCOUNTER — Encounter: Payer: Self-pay | Admitting: Family Medicine

## 2016-12-12 VITALS — BP 140/80 | Temp 98.1°F | Ht 70.0 in | Wt 214.8 lb

## 2016-12-12 DIAGNOSIS — R1012 Left upper quadrant pain: Secondary | ICD-10-CM | POA: Diagnosis not present

## 2016-12-12 DIAGNOSIS — R350 Frequency of micturition: Secondary | ICD-10-CM | POA: Diagnosis not present

## 2016-12-12 DIAGNOSIS — R601 Generalized edema: Secondary | ICD-10-CM

## 2016-12-12 LAB — POCT URINALYSIS DIP (MANUAL ENTRY)
Bilirubin, UA: NEGATIVE
Glucose, UA: NEGATIVE mg/dL
Leukocytes, UA: NEGATIVE
Nitrite, UA: NEGATIVE
PH UA: 5.5 (ref 5.0–8.0)
PROTEIN UA: NEGATIVE mg/dL
RBC UA: NEGATIVE
UROBILINOGEN UA: 0.2 U/dL

## 2016-12-12 LAB — POCT GLYCOSYLATED HEMOGLOBIN (HGB A1C): HEMOGLOBIN A1C: 5.4

## 2016-12-12 NOTE — Patient Instructions (Addendum)
Thank you so much for coming to visit today! Your urine was clear. We will check some blood work today to try to find further answers to your symptoms.  Follow up pending lab results.  Dr. Gerlean Ren

## 2016-12-13 ENCOUNTER — Telehealth: Payer: Self-pay | Admitting: Family Medicine

## 2016-12-13 LAB — CMP14+EGFR
ALT: 16 IU/L (ref 0–32)
AST: 14 IU/L (ref 0–40)
Albumin/Globulin Ratio: 1.5 (ref 1.2–2.2)
Albumin: 3.9 g/dL (ref 3.5–5.5)
Alkaline Phosphatase: 58 IU/L (ref 39–117)
BUN/Creatinine Ratio: 16 (ref 9–23)
BUN: 14 mg/dL (ref 6–24)
Bilirubin Total: <0.2 mg/dL (ref 0.0–1.2)
CALCIUM: 9.3 mg/dL (ref 8.7–10.2)
CO2: 20 mmol/L (ref 20–29)
CREATININE: 0.86 mg/dL (ref 0.57–1.00)
Chloride: 108 mmol/L — ABNORMAL HIGH (ref 96–106)
GFR calc Af Amer: 96 mL/min/{1.73_m2} (ref >59)
GFR, EST NON AFRICAN AMERICAN: 83 mL/min/{1.73_m2} (ref >59)
GLUCOSE: 105 mg/dL — AB (ref 65–99)
Globulin, Total: 2.6 g/dL (ref 1.5–4.5)
POTASSIUM: 4.2 mmol/L (ref 3.5–5.2)
Sodium: 142 mmol/L (ref 134–144)
Total Protein: 6.5 g/dL (ref 6.0–8.5)

## 2016-12-13 LAB — CBC
HEMOGLOBIN: 13 g/dL (ref 11.1–15.9)
Hematocrit: 38.7 % (ref 34.0–46.6)
MCH: 32 pg (ref 26.6–33.0)
MCHC: 33.6 g/dL (ref 31.5–35.7)
MCV: 95 fL (ref 79–97)
Platelets: 307 10*3/uL (ref 150–379)
RBC: 4.06 x10E6/uL (ref 3.77–5.28)
RDW: 13 % (ref 12.3–15.4)
WBC: 8.6 10*3/uL (ref 3.4–10.8)

## 2016-12-13 LAB — ANA: Anti Nuclear Antibody(ANA): NEGATIVE

## 2016-12-13 LAB — RHEUMATOID FACTOR: Rhuematoid fact SerPl-aCnc: <10 IU/mL (ref 0.0–13.9)

## 2016-12-13 NOTE — Telephone Encounter (Signed)
Face is now swollen.  Pt is concerned.  Thinks she needs more antibiotics.  She uses [facility] on [address]. Please advise

## 2016-12-13 NOTE — Telephone Encounter (Signed)
Called patient back and informed her that after speaking with PCP she would have to be re-evaluated for additional ABX.  Patient voiced understanding and made a same day appt for tomorrow morning at 9am.  Patient states that she will bring her MRI cd from her scan today.  Also the swelling has "spread" to her face now from her eye.  She is not having any relief with cold compressions but has had some relief with the eye drops.  Patient advised to seek care in ED/UC if symptoms worsen.  Jazmin Hartsell,CMA

## 2016-12-13 NOTE — Progress Notes (Signed)
Subjective:     Patient ID: Amber Villarreal, female   DOB: Oct 03, 1975, 41 y.o.   MRN: [id number]  HPI Amber Villarreal is a 41yo female presenting today for multiple concerns. Reports one month history intermittent swelling of joints along right side of body. Also with swelling of right eye, which she is following with Ophthalmology for. Scheduled to have imaging of eye per Ophthalmology. Prescribed Doxycycline and TobraDex by Ophthalmology.   Also notes intermittent blanching of fingers, exacerbated by cold. Notes increased thirst and urination. Was told by Ophthalmology she should be evaluated for Raynaulds.  Also with LUQ abdominal pain. Denies nausea or vomiting. Pain is worse with food. Present for 3 months. Has been told in past this may be due to GERD or gastric ulcers, but she refuses treatment for this. Pain improved with green tea with lemons. Notes increased thirst and urinary frequency. Denies dysuria or urgency. Denies hematuria.   Review of Systems Per HPI.    Objective:   Physical Exam  Constitutional: She appears well-developed and well-nourished. No distress.  HENT:  Head: Normocephalic.  Eyes: EOM are normal. Pupils are equal, round, and reactive to light.  Swelling of right eye noted, unchanged from prior per patient  Neck: No thyromegaly present.  Cardiovascular: Normal rate and regular rhythm.   No murmur heard. Pulmonary/Chest: Effort normal. No respiratory distress. She has no wheezes.  Abdominal: Soft. She exhibits no distension and no mass. There is no rebound.  Poorly localized pain in left upper quadrant. No hepatosplenomegaly. Due to poor localization, unable to conduct [place].  Musculoskeletal: She exhibits no edema.  Psychiatric: She has a normal mood and affect. Her behavior is normal.      Assessment and Plan:     1. Abdominal pain, left upper quadrant Refuses PPI. Urinalysis normal. Will check labs, please see below.  2. Generalized edema.  Intermittent  in right sided joints per patient with possible history of Raynaulds. Will obtain CMP, CBC, ANA, and RF. Continue evaluation per Ophthalmology for right eye swelling. Follow up pending labs.

## 2016-12-14 ENCOUNTER — Ambulatory Visit (INDEPENDENT_AMBULATORY_CARE_PROVIDER_SITE_OTHER): Payer: Medicaid Other | Admitting: Family Medicine

## 2016-12-14 ENCOUNTER — Ambulatory Visit: Payer: Medicaid Other | Admitting: Internal Medicine

## 2016-12-14 ENCOUNTER — Encounter: Payer: Self-pay | Admitting: Family Medicine

## 2016-12-14 DIAGNOSIS — H02843 Edema of right eye, unspecified eyelid: Secondary | ICD-10-CM

## 2016-12-14 NOTE — Assessment & Plan Note (Signed)
Unclear etiology. Less likely infectious as patient has had 2 courses of antibiotics without any changes. She saw Dr. Katy Fitch at ophthalmology who said that this was likely chronic inflammation after reviewing a CT scan of her face. He apparently wanted to start her on steroids today. I do not have the records from this visit however, this is from patient report. Discussed with patient how I would like to call Dr. Katy Fitch and confirm this diagnosis and she became irate saying "you need to get your head checked if you don't know what's wrong with me". A few minutes later she was laughing and trying to joke around. Highly suspicious of mania. -Continue cold compresses -Continue Pataday eyedrops -We will follow with Dr. Zenia Resides recommendations when we receive documentation of this visit -Of note, would recommend the patient see psychiatry in the future as she has history of bipolar and mood/affect today is congruent with that. I do not see that she is on any bipolar medications.

## 2016-12-14 NOTE — Progress Notes (Signed)
Subjective:    Patient ID: Amber Villarreal , female   DOB: August 04, 1975 , 41 y.o..   MRN: [id number]  HPI  Amber Villarreal is here for  Chief Complaint  Patient presents with  . Facial Swelling    1. Right eye swelling: Right eye swelling for the last month. Saw eye doctor, Dr. Katy Fitch who ordered a CT scan. Patient notes that the eye doctor told her that the CT scan showed inflammation of the tear ducts. He has ordered prednisone for her which is apparently waiting for her at the pharmacy. Has tried a course of clindamycin and doxycycline in the past, last dose was Tuesday morning. No fever, sore throat, shortness of breath, blurry vision. Not on any medications such as ACE inhibitor which would make her prone to angioedema.   Review of Systems: Per HPI.   Past Medical History: Patient Active Problem List   Diagnosis Date Noted  . Swelling of eyelid, right 11/27/2016  . Blepharitis of right upper eyelid 11/14/2016  . Morning headache 09/11/2016  . Contraceptive management 04/05/2016  . Family history of malignant neoplasm of gastrointestinal tract 10/07/2015  . Melanocytic nevi of right upper limb, including shoulder 10/07/2015  . Skin rash 05/12/2015  . Alcohol abuse 09/04/2013  . High risk sexual behavior 09/19/2012  . Breast nodule 08/04/2012  . Obesity 06/01/2011  . BIPOLAR DISORDER UNSPECIFIED 06/10/2008    Medications: reviewed and updated Current Outpatient Prescriptions  Medication Sig Dispense Refill  . albuterol (PROVENTIL HFA;VENTOLIN HFA) 108 (90 Base) MCG/ACT inhaler Inhale 2 puffs into the lungs every 6 (six) hours as needed for wheezing or shortness of breath. 1 Inhaler 0  . aspirin-acetaminophen-caffeine (EXCEDRIN MIGRAINE) 250-250-65 MG tablet Take 1 tablet by mouth every 6 (six) hours as needed for headache.    . clindamycin (CLEOCIN) 300 MG capsule Take 1 capsule (300 mg total) by mouth every 8 (eight) hours. x10 days 30 capsule 0  . Multiple Vitamin (MULTIVITAMIN  WITH MINERALS) TABS tablet Take 1 tablet by mouth daily.    . Olopatadine HCl (PATADAY) 0.2 % SOLN Apply 1 drop to eye 2 (two) times daily. 2.5 mL 0  . pantoprazole (PROTONIX) 40 MG tablet Take 1 tablet (40 mg total) by mouth daily. 30 tablet 2  . trimethoprim-polymyxin b (POLYTRIM) ophthalmic solution Place 1 drop into the right eye 4 (four) times daily. 10 mL 0   No current facility-administered medications for this visit.     Social Hx:  reports that she quit smoking about 8 years ago. She has never used smokeless tobacco.   Objective:   BP 136/68   Temp 98.3 F (36.8 C) (Oral)   Ht 5\' 10"  (1.778 m)   Wt 215 lb 6.4 oz (97.7 kg)   BMI 30.91 kg/m  Physical Exam  Gen: NAD, alert, cooperative with exam, well-appearing HEENT: NCAT, PERRL, clear conjunctiva, swelling over right upper eyelid  Psych: Labile mood and affect, rapid speech  Assessment & Plan:  Swelling of eyelid, right Unclear etiology. Less likely infectious as patient has had 2 courses of antibiotics without any changes. She saw Dr. Katy Fitch at ophthalmology who said that this was likely chronic inflammation after reviewing a CT scan of her face. He apparently wanted to start her on steroids today. I do not have the records from this visit however, this is from patient report. Discussed with patient how I would like to call Dr. Katy Fitch and confirm this diagnosis and she became irate saying "you need to  get your head checked if you don't know what's wrong with me". A few minutes later she was laughing and trying to joke around. Highly suspicious of mania. -Continue cold compresses -Continue Pataday eyedrops -We will follow with Dr. Zenia Resides recommendations when we receive documentation of this visit -Of note, would recommend the patient see psychiatry in the future as she has history of bipolar and mood/affect today is congruent with that. I do not see that she is on any bipolar medications.   Smitty Cords, MD [place], PGY-2

## 2016-12-14 NOTE — Progress Notes (Deleted)
   [facility] Phone: [phone]   Date of Visit: 12/14/2016   HPI:  ***  ROS: See HPI.  [place]:  PMH:   PHYSICAL EXAM: There were no vitals taken for this visit. Gen: *** HEENT: *** Heart: *** Lungs: *** Neuro: *** Ext: ***  ASSESSMENT/PLAN:  Health maintenance:  -***  No problem-specific Assessment & Plan notes found for this encounter.  FOLLOW UP: Follow up in *** for ***  Smiley Houseman, MD PGY [facility]

## 2016-12-18 ENCOUNTER — Encounter: Payer: Self-pay | Admitting: Family Medicine

## 2016-12-28 ENCOUNTER — Encounter: Payer: Self-pay | Admitting: Student in an Organized Health Care Education/Training Program

## 2017-01-31 ENCOUNTER — Telehealth: Payer: Self-pay | Admitting: Student in an Organized Health Care Education/Training Program

## 2017-02-05 ENCOUNTER — Ambulatory Visit (INDEPENDENT_AMBULATORY_CARE_PROVIDER_SITE_OTHER): Payer: Medicaid Other | Admitting: *Deleted

## 2017-02-05 DIAGNOSIS — Z111 Encounter for screening for respiratory tuberculosis: Secondary | ICD-10-CM

## 2017-02-05 NOTE — Progress Notes (Signed)
   PPD placed Left Forearm.  Pt to return Thursday 02/07/2017 for reading.  Pt tolerated intradermal injection. Derl Barrow, RN

## 2017-02-07 ENCOUNTER — Ambulatory Visit (INDEPENDENT_AMBULATORY_CARE_PROVIDER_SITE_OTHER): Payer: Medicaid Other | Admitting: *Deleted

## 2017-02-07 ENCOUNTER — Encounter: Payer: Self-pay | Admitting: *Deleted

## 2017-02-07 DIAGNOSIS — Z111 Encounter for screening for respiratory tuberculosis: Secondary | ICD-10-CM

## 2017-02-07 LAB — TB SKIN TEST
Induration: 0 mm
TB Skin Test: NEGATIVE

## 2017-02-07 NOTE — Progress Notes (Signed)
   PPD Reading Note PPD read and results entered in EpicCare. Result: 0 mm induration. Interpretation: Negative If test not read within 48-72 hours of initial placement, patient advised to repeat in other arm 1-3 weeks after this test. Allergic reaction: no  Koal Eslinger L, RN  

## 2017-04-29 ENCOUNTER — Ambulatory Visit: Payer: Self-pay | Admitting: Student in an Organized Health Care Education/Training Program

## 2017-05-02 ENCOUNTER — Encounter: Payer: Self-pay | Admitting: Student in an Organized Health Care Education/Training Program

## 2017-05-02 ENCOUNTER — Ambulatory Visit: Payer: Medicaid Other | Admitting: Student in an Organized Health Care Education/Training Program

## 2017-05-02 VITALS — BP 126/64 | HR 84 | Temp 98.3°F | Ht 70.0 in | Wt 207.4 lb

## 2017-05-02 DIAGNOSIS — M791 Myalgia, unspecified site: Secondary | ICD-10-CM | POA: Diagnosis not present

## 2017-05-02 DIAGNOSIS — H0011 Chalazion right upper eyelid: Secondary | ICD-10-CM

## 2017-05-02 DIAGNOSIS — I7300 Raynaud's syndrome without gangrene: Secondary | ICD-10-CM | POA: Diagnosis not present

## 2017-05-02 NOTE — Patient Instructions (Signed)
It was a pleasure seeing you today in our clinic. Today we discussed your symptoms. Here is the treatment plan we have discussed and agreed upon together:  We drew blood work at today's visit. I will call or send you a letter with these results. If you do not hear from me within the next week, please give our office a call.  Our clinic's number is [phone]. Please call with questions or concerns about what we discussed today.  Be well, Dr. Burr Medico

## 2017-05-02 NOTE — Progress Notes (Signed)
CC: eye swelling, joint pain  HPI: Amber Villarreal is a 41 y.o. female with PMH significant for obesity, high risk sexual behavior, bipolar disorder, alcohol abuse who presents to [facility] today with eye swelling, joint pain.   Previous workup/patient concern for rheumatologic disease Patient reports that she developed left eye swelling in May for which she was sent to an eye doctor (she said one eye doctor referred her to another so she believes she saw ophthalmology) and she was treated with prednisone. She states that after taking prednisone, she began noticing joint pain including her right forearm, her bilateral knees, and she had numbness and tingling in her bilateral legs. She started taking multiple supplements including tumeric, vitamin B complex, vitamin D, which she feels have helped. She states that her eye swelling improved with prednisone but came back after she stopped prednisone. She reports that she discontinued prednisone because of a herpes outbreak (although the "outbreak" is described as bumps on her arms and face, not vaginal or oral lesions). Patient also endorses her fingers turn white in the cold, improve on their own when warmed consistent with Raynaud syndrome. No fevers, night sweats or weight loss.  Patient was seen previously for these complaints and ANA and RF were WNL. TSH was WNL. She spoke with a doctor of a family member who suggested that she be worked up for rheumatologic problems. Discussed today that these tests were already completed and WNL.  Chalazion Patient has a swelling under the right upper eyelid. She reports that this does not obstruct her vision. She denies blurry vision. She denies eye pain. She reports she was given steroids for this problem in the past and it has returned. She reports she has seen ophthalmology for this problem. She very strongly refuses to go back to the eye doctor, insisting that her problems with Raynaud syndrome and bilateral leg  paresthesias are related to her eye problem, and she prefers that we find an answer tying these things together.  Review of Symptoms:  See HPI for ROS.   CC, SH/smoking status, and VS noted.  Objective: BP 126/64   Pulse 84   Temp 98.3 F (36.8 C) (Oral)   Ht '5\' 10"'$  (1.778 m)   Wt 207 lb 6.4 oz (94.1 kg)   LMP 04/29/2017 (Approximate)   SpO2 100%   BMI 29.76 kg/m  GEN: NAD, alert EYE: +lesion noted under right eyelid with swelling, no redness or drainage. no conjunctival injection, pupils equally round and reactive to light. Visual acuity intact. ENMT: normal tympanic light reflex, no nasal polyps,no rhinorrhea, no pharyngeal erythema or exudates NECK: full ROM, no thyromegaly RESPIRATORY: clear to auscultation bilaterally with no wheezes, rhonchi or rales, good effort CV: RRR, no m/r/g, no peripheral edema GI: soft, non-tender, non-distended, no hepatosplenomegaly SKIN: warm and dry, no rashes or lesions NEURO: II-XII grossly intact, normal gait, peripheral sensation intact, 5/5 muscle strength in 4 extremities PSYCH: AAOx3, appropriate affect  Assessment and plan:  Chalazion of right upper eyelid Patient is refusing referral to ophthalmology as noted above. Explained it is possible this lesion may be surgically removed. Patient understands but is certain this lesion is associated with her other symptoms. - if patient changes her mind, ophtho referral - no red flag symptoms; no redness or drainage, no visual acuity changes, no eye pain  Generalized muscle ache Patient feels this is associated with bilateral LE paresthesias and eye lesion, as well as Raynaud syndrome.  - reassuring that ANA and RF  were negative - checked ESR and CRP to see if there is an inflammatory process we are missing; these are also negative - discussed with patient - she requests a rheum referral, which would not be appropriate in this case given that rheum workup so far is negative - TSH rechecked and  WNL  Raynaud's syndrome Recommended patient wear gloves in the cold to prevent Raynaud syndrome. Again, she is not satisfied with this plan and is very attached to finding a unifying diagnosis, despite long discussion about how it is likely there is no unifying diagnosis with Raynaud, chalazion and muscle aches.   Orders Placed This Encounter  Procedures  . Sedimentation Rate  . C-reactive protein  . TSH    Everrett Coombe, MD,MS,  PGY2 05/07/2017 7:38 AM

## 2017-05-03 LAB — C-REACTIVE PROTEIN: CRP: 1.2 mg/L (ref 0.0–4.9)

## 2017-05-03 LAB — TSH: TSH: 0.719 u[IU]/mL (ref 0.450–4.500)

## 2017-05-03 LAB — SEDIMENTATION RATE: SED RATE: 2 mm/h (ref 0–32)

## 2017-05-07 ENCOUNTER — Encounter: Payer: Self-pay | Admitting: Student in an Organized Health Care Education/Training Program

## 2017-05-07 DIAGNOSIS — I7300 Raynaud's syndrome without gangrene: Secondary | ICD-10-CM | POA: Insufficient documentation

## 2017-05-07 DIAGNOSIS — H0011 Chalazion right upper eyelid: Secondary | ICD-10-CM | POA: Insufficient documentation

## 2017-05-07 DIAGNOSIS — M791 Myalgia, unspecified site: Secondary | ICD-10-CM | POA: Insufficient documentation

## 2017-05-07 NOTE — Assessment & Plan Note (Addendum)
Recommended patient wear gloves in the cold to prevent Raynaud syndrome. Again, she is not satisfied with this plan and is very attached to finding a unifying diagnosis, despite long discussion about how it is likely there is no unifying diagnosis with Raynaud, chalazion and muscle aches.

## 2017-05-07 NOTE — Assessment & Plan Note (Signed)
Patient is refusing referral to ophthalmology as noted above. Explained it is possible this lesion may be surgically removed. Patient understands but is certain this lesion is associated with her other symptoms. - if patient changes her mind, ophtho referral - no red flag symptoms; no redness or drainage, no visual acuity changes, no eye pain

## 2017-05-07 NOTE — Assessment & Plan Note (Addendum)
Patient feels this is associated with bilateral LE paresthesias and eye lesion, as well as Raynaud syndrome.  - reassuring that ANA and RF were negative - checked ESR and CRP to see if there is an inflammatory process we are missing; these are also negative - discussed with patient - she requests a rheum referral, which would not be appropriate in this case given that rheum workup so far is negative - TSH rechecked and WNL

## 2017-07-31 ENCOUNTER — Ambulatory Visit (HOSPITAL_COMMUNITY)
Admission: EM | Admit: 2017-07-31 | Discharge: 2017-07-31 | Disposition: A | Discharge status: Home / Self Care | Payer: Medicaid Other | Attending: Emergency Medicine | Admitting: Emergency Medicine

## 2017-07-31 ENCOUNTER — Other Ambulatory Visit: Payer: Self-pay | Admitting: Student in an Organized Health Care Education/Training Program

## 2017-07-31 ENCOUNTER — Encounter (HOSPITAL_COMMUNITY): Payer: Self-pay | Admitting: *Deleted

## 2017-07-31 DIAGNOSIS — Z880 Allergy status to penicillin: Secondary | ICD-10-CM | POA: Insufficient documentation

## 2017-07-31 DIAGNOSIS — N6310 Unspecified lump in the right breast, unspecified quadrant: Secondary | ICD-10-CM | POA: Insufficient documentation

## 2017-07-31 DIAGNOSIS — F319 Bipolar disorder, unspecified: Secondary | ICD-10-CM | POA: Insufficient documentation

## 2017-07-31 DIAGNOSIS — Z881 Allergy status to other antibiotic agents status: Secondary | ICD-10-CM | POA: Insufficient documentation

## 2017-07-31 DIAGNOSIS — Z202 Contact with and (suspected) exposure to infections with a predominantly sexual mode of transmission: Secondary | ICD-10-CM

## 2017-07-31 DIAGNOSIS — Z113 Encounter for screening for infections with a predominantly sexual mode of transmission: Secondary | ICD-10-CM

## 2017-07-31 DIAGNOSIS — B9689 Other specified bacterial agents as the cause of diseases classified elsewhere: Secondary | ICD-10-CM

## 2017-07-31 DIAGNOSIS — Z87891 Personal history of nicotine dependence: Secondary | ICD-10-CM | POA: Diagnosis not present

## 2017-07-31 DIAGNOSIS — Z3202 Encounter for pregnancy test, result negative: Secondary | ICD-10-CM

## 2017-07-31 DIAGNOSIS — N760 Acute vaginitis: Secondary | ICD-10-CM

## 2017-07-31 LAB — POCT PREGNANCY, URINE: PREG TEST UR: NEGATIVE

## 2017-07-31 LAB — POCT URINALYSIS DIP (DEVICE)
Bilirubin Urine: NEGATIVE
GLUCOSE, UA: NEGATIVE mg/dL
Hgb urine dipstick: NEGATIVE
Ketones, ur: NEGATIVE mg/dL
LEUKOCYTES UA: NEGATIVE
NITRITE: NEGATIVE
PROTEIN: NEGATIVE mg/dL
Specific Gravity, Urine: 1.025 (ref 1.005–1.030)
Urobilinogen, UA: 0.2 mg/dL (ref 0.0–1.0)
pH: 5.5 (ref 5.0–8.0)

## 2017-07-31 MED ORDER — CEFTRIAXONE SODIUM 250 MG IJ SOLR
250.0000 mg | Freq: Once | INTRAMUSCULAR | Status: AC
  Administered 2017-07-31: 250 mg via INTRAMUSCULAR

## 2017-07-31 MED ORDER — PENICILLIN G BENZATHINE 1200000 UNIT/2ML IM SUSP
2.4000 10*6.[IU] | Freq: Once | INTRAMUSCULAR | Status: AC
  Administered 2017-07-31: 2.4 10*6.[IU] via INTRAMUSCULAR

## 2017-07-31 MED ORDER — METRONIDAZOLE 500 MG PO TABS: 500.0000 mg | ORAL_TABLET | Freq: Two times a day (BID) | ORAL | 0 refills | Status: AC

## 2017-07-31 MED ORDER — LIDOCAINE HCL (PF) 1 % IJ SOLN
INTRAMUSCULAR | Status: AC
  Filled 2017-07-31: qty 2

## 2017-07-31 MED ORDER — AZITHROMYCIN 250 MG PO TABS
ORAL_TABLET | ORAL | Status: AC
  Filled 2017-07-31: qty 4

## 2017-07-31 MED ORDER — PENICILLIN G BENZATHINE 1200000 UNIT/2ML IM SUSP
INTRAMUSCULAR | Status: AC
  Filled 2017-07-31: qty 4

## 2017-07-31 MED ORDER — CEFTRIAXONE SODIUM 250 MG IJ SOLR
INTRAMUSCULAR | Status: AC
  Filled 2017-07-31: qty 250

## 2017-07-31 MED ORDER — CEPHALEXIN 500 MG PO CAPS: 500.0000 mg | ORAL_CAPSULE | Freq: Four times a day (QID) | ORAL | 0 refills | Status: AC

## 2017-07-31 MED ORDER — IBUPROFEN 600 MG PO TABS: 600.0000 mg | ORAL_TABLET | Freq: Four times a day (QID) | ORAL | 0 refills | Status: DC | PRN

## 2017-07-31 MED ORDER — AZITHROMYCIN 250 MG PO TABS
1000.0000 mg | ORAL_TABLET | Freq: Once | ORAL | Status: AC
  Administered 2017-07-31: 1000 mg via ORAL

## 2017-07-31 NOTE — ED Notes (Signed)
On discharge, patient denies any rash, no itching, no sob.  "feel fine"

## 2017-07-31 NOTE — Discharge Instructions (Signed)
Call the breast center and schedule an appointment.  I have sent in a referral.  Finish the Keflex.  I am treating as if this is an infection of the soft tissue.  Finish all of the Flagyl.  600 mg ibuprofen to take with 1 g of Tylenol 3-4 times a day as needed for pain.  Follow-up with 1 of the primary care providers listed below.   Below is a list of primary care practices who are taking new patients for you to follow-up with. [facility] [place], [place] [address] [phone]  [facility] [phone] [place] [address]  [facility]: [place]  [phone]  [place] [facility]: [place]   [phone]  [facility]: [address] [place]  [phone]  [place] [facility] : [address] [phone]  [facility]: [address] [place] [address] [phone]  [facility]: [place] [phone]  Dr. Blanchie Serve [place]  [phone]  Dr. Benito Mccreedy, [facility]. [place],  [address]  [phone]  Go to www.goodrx.com to look up your medications. This will give you a list of where you can find your prescriptions at the most affordable prices. Or ask the pharmacist what the cash price is, or if they have any other discount programs available to help make your medication more affordable. This can be less expensive than what you would pay with insurance.

## 2017-07-31 NOTE — ED Provider Notes (Signed)
HPI  SUBJECTIVE:  Amber Villarreal is a 42 y.o. female who presents with two complaints.  First she reports a painful, sore right breast mass starting yesterday.  She reports localized erythema.  No nipple discharge, skin changes.  She is known to have a mass in this area that was found on CT scan several years ago.  She has had this ultrasounded and was told that it needed to be watched.  No trauma, insect bite, itching, burning.  It is worse with palpation.  No alleviating factors.  Patient has not tried anything for this.  Second, patient is requesting STD screening and treatment.  She states that her sexual partner told her that he tested positive for gonorrhea, chlamydia and syphilis.  She denies having any other sexual partners.  She states that she has not had sexual intercourse in several months, but has had unusual symptoms such as intermittent lymphadenopathy, 15 pound unintentional weight loss.  She reports clear vaginal discharge, odor, itching, bilateral, intermittent pelvic pain.  She reports "vaginal bumps" starting several days ago.  She also reports dysuria, urgency, frequency, cloudy odorous urine and irregular vaginal bleeding.  No night sweats, abdominal, back pain.  No rash.  Denies dyspareunia.  She has tried ibuprofen for her symptoms with improvement.  She states her symptoms are worse with walking.  She has a past medical history of chlamydia, syphilis, Trichomonas, BV, yeast, ectopic pregnancy.  She denies any history of gonorrhea, HIV, HSV, PID, diabetes, hypertension, UTI, pyelonephritis.  LMP: Last month.  She denies possibility of being pregnant.  PMD: [facility]    Past Medical History:  Diagnosis Date  . Anemia    history of  . Bipolar disorder, unspecified ([place])   . Hx of cardiovascular stress test    a. Lex MV 2/14: EF 50%, no ischemia, small L breast nodule  . Opioid type dependence, unspecified     Past Surgical History:  Procedure Laterality Date  .  DILATION AND CURETTAGE OF UTERUS      Family History  Problem Relation Age of Onset  . Heart disease Mother 22       bypass  . Diabetes Mother   . Hypertension Mother   . Cancer Mother        vaginal cancer  . Congestive Heart Failure Mother        early 4's  . Stroke Mother   . Coronary artery disease Mother   . Hypertension Sister   . Coronary artery disease Unknown   . Coronary artery disease Maternal Aunt   . Colon cancer Maternal Uncle   . Prostate cancer Neg Hx     Social History   Tobacco Use  . Smoking status: Former Smoker    Last attempt to quit: 06/25/2008    Years since quitting: 9.1  . Smokeless tobacco: Never Used  Substance Use Topics  . Alcohol use: Yes    Alcohol/week: 3.0 oz    Types: 3 Cans of beer, 2 Shots of liquor per week    Comment: Occasional  . Drug use: No    Comment: history of marijuana use    No current facility-administered medications for this encounter.   Current Outpatient Medications:  .  albuterol (PROVENTIL HFA;VENTOLIN HFA) 108 (90 Base) MCG/ACT inhaler, Inhale 2 puffs into the lungs every 6 (six) hours as needed for wheezing or shortness of breath., Disp: 1 Inhaler, Rfl: 0 .  Multiple Vitamin (MULTIVITAMIN WITH MINERALS) TABS tablet, Take 1 tablet by  mouth daily., Disp: , Rfl:  .  aspirin-acetaminophen-caffeine (EXCEDRIN MIGRAINE) 250-250-65 MG tablet, Take 1 tablet by mouth every 6 (six) hours as needed for headache., Disp: , Rfl:  .  cephALEXin (KEFLEX) 500 MG capsule, Take 1 capsule (500 mg total) by mouth 4 (four) times daily for 5 days. X 5 days, Disp: 20 capsule, Rfl: 0 .  ibuprofen (ADVIL,MOTRIN) 600 MG tablet, Take 1 tablet (600 mg total) by mouth every 6 (six) hours as needed., Disp: 30 tablet, Rfl: 0 .  metroNIDAZOLE (FLAGYL) 500 MG tablet, Take 1 tablet (500 mg total) by mouth 2 (two) times daily for 7 days., Disp: 14 tablet, Rfl: 0 .  Olopatadine HCl (PATADAY) 0.2 % SOLN, Apply 1 drop to eye 2 (two) times daily., Disp:  2.5 mL, Rfl: 0 .  pantoprazole (PROTONIX) 40 MG tablet, Take 1 tablet (40 mg total) by mouth daily., Disp: 30 tablet, Rfl: 2  Allergies  Allergen Reactions  . Metronidazole Other (See Comments)    Heart hurt  . Penicillins     From when younger Has patient had a PCN reaction causing immediate rash, facial/tongue/throat swelling, SOB or lightheadedness with hypotension: No Has patient had a PCN reaction causing severe rash involving mucus membranes or skin necrosis: No Has patient had a PCN reaction that required hospitalization No Has patient had a PCN reaction occurring within the last 10 years: No If all of the above answers are "NO", then may proceed with Cephalosporin use.      ROS  As noted in HPI.   Physical Exam  BP 123/83 (BP Location: Left Arm)   Pulse 85   Temp 98.5 F (36.9 C) (Oral)   Resp 16   LMP 06/30/2017   SpO2 99%   Constitutional: Well developed, well nourished, no acute distress Eyes:  EOMI, conjunctiva normal bilaterally HENT: Normocephalic, atraumatic,mucus membranes moist Respiratory: Normal inspiratory effort Cardiovascular: Normal rate Breasts: Pendulous breasts bilaterally, symmetric.  Right breast: 3 x 1.5 cm tender area in the 11 o'clock position right next to the nipple.  No skin changes, induration.  No expressible purulent drainage.  Left breast normal Lymph: No axillary lymphadenopathy GI: nondistended soft, nontender. no suprapubic tenderness  back: No CVA tenderness GU: External genitalia normal.  Normal vaginal mucosa.  Normal os. Thin nonoderous  white vaginal discharge.   Uterus smooth, mildly tender. No  CMT. No  adnexal tenderness. No adnexal masses.  Chaperone present during exam skin: No rash, skin intact Musculoskeletal: no deformities Neurologic: Alert & oriented x 3, no focal neuro deficits Psychiatric: Speech and behavior appropriate   ED Course   Medications  cefTRIAXone (ROCEPHIN) injection 250 mg (250 mg Intramuscular  Given 07/31/17 1323)  azithromycin (ZITHROMAX) tablet 1,000 mg (1,000 mg Oral Given 07/31/17 1319)  penicillin g benzathine (BICILLIN LA) 1200000 UNIT/2ML injection 2.4 Million Units (2.4 Million Units Intramuscular Given 07/31/17 1320)    Orders Placed This Encounter  Procedures  . Ambulatory referral to Breast Clinic    Referral Priority:   Routine    Referral Type:   Consultation    Referral Reason:   Specialty Services Required    Requested Specialty:   Breast Surgery    Number of Visits Requested:   1  . POCT urinalysis dip (device)    Standing Status:   Standing    Number of Occurrences:   1  . Pregnancy, urine POC    Standing Status:   Standing    Number of Occurrences:   1  No results found for this or any previous visit (from the past 24 hour(s)). No results found.  ED Clinical Impression  STD exposure  Bacterial vaginosis  Screen for STD (sexually transmitted disease)  Breast mass, right   ED Assessment/Plan  1.  Breast mass.  We will treat this as if this is infection with Keflex 500 mg 4 times daily for 5 days.  Patient states that she has tolerated penicillins without any problem previously.  Will provide referral to the breast center for further diagnosis.  She will need to follow-up with her primary care physician at the [facility] family practice center for continued care and f/u of results   2.  STD screening.  Patient states that partner tested positive for gonorrhea, chlamydia, syphilis, so we will treat empirically for these with Rocephin 250 mg IM x1, azithromycin 1 g p.o., 2,400,000 units of Bicillin x1.  Testing for gonorrhea, chlamydia, HIV, syphilis, Trichomonas, BV and yeast. I Will send home with Flagyl as I suspect that she has BV.  Does not remember what reaction she had to the Flagyl.  She denies anaphylaxis.  She has no evidence of PID.  She is not pregnant.  Her urine is negative for UTI. Pt provided working phone number. Follow-up with PMD as needed.  Discussed labs, MDM, plan and followup with patient.  Signs and symptoms that should prompt return to the emergency department.  Pt agrees with plan.   Meds ordered this encounter  Medications  . cefTRIAXone (ROCEPHIN) injection 250 mg  . azithromycin (ZITHROMAX) tablet 1,000 mg  . penicillin g benzathine (BICILLIN LA) 1200000 UNIT/2ML injection 2.4 Million Units    Order Specific Question:   Antibiotic Indication:    Answer:   Syphilis  . cephALEXin (KEFLEX) 500 MG capsule    Sig: Take 1 capsule (500 mg total) by mouth 4 (four) times daily for 5 days. X 5 days    Dispense:  20 capsule    Refill:  0  . ibuprofen (ADVIL,MOTRIN) 600 MG tablet    Sig: Take 1 tablet (600 mg total) by mouth every 6 (six) hours as needed.    Dispense:  30 tablet    Refill:  0  . metroNIDAZOLE (FLAGYL) 500 MG tablet    Sig: Take 1 tablet (500 mg total) by mouth 2 (two) times daily for 7 days.    Dispense:  14 tablet    Refill:  0    *This clinic note was created using [employer]. Therefore, there may be occasional mistakes despite careful proofreading.  ?    Melynda Ripple, MD 08/01/17 1351

## 2017-07-31 NOTE — ED Notes (Signed)
Discussed patient's medicines and listed allergies.  No known or specific incident known related to a reaction.

## 2017-07-31 NOTE — ED Triage Notes (Signed)
Patient reports right breast mass that she noticed yesterday.   Patient also states she was called yesterday and told that she was exposed to multiple STDs. To chlamydia, gonorrhea, and syphilis. Patient states she has had pelvic pain and has bumps in vaginal area. Would like to be tested for HIV as well.

## 2017-08-01 LAB — HIV ANTIBODY (ROUTINE TESTING W REFLEX): HIV Screen 4th Generation wRfx: NONREACTIVE

## 2017-08-01 LAB — CERVICOVAGINAL ANCILLARY ONLY
Bacterial vaginitis: NEGATIVE
Candida vaginitis: NEGATIVE
Chlamydia: NEGATIVE
Neisseria Gonorrhea: NEGATIVE
Trichomonas: NEGATIVE

## 2017-08-01 LAB — RPR: RPR Ser Ql: NONREACTIVE

## 2017-09-06 ENCOUNTER — Other Ambulatory Visit: Payer: Self-pay | Admitting: Emergency Medicine

## 2017-09-06 DIAGNOSIS — N644 Mastodynia: Secondary | ICD-10-CM

## 2017-09-13 ENCOUNTER — Other Ambulatory Visit: Payer: Self-pay

## 2017-09-18 ENCOUNTER — Ambulatory Visit
Admission: RE | Admit: 2017-09-18 | Discharge: 2017-09-18 | Disposition: A | Discharge status: Home / Self Care | Payer: Medicaid Other | Source: Ambulatory Visit | Attending: Emergency Medicine | Admitting: Emergency Medicine

## 2017-09-18 ENCOUNTER — Other Ambulatory Visit: Payer: Self-pay | Admitting: Emergency Medicine

## 2017-09-18 DIAGNOSIS — N6310 Unspecified lump in the right breast, unspecified quadrant: Secondary | ICD-10-CM

## 2017-09-18 DIAGNOSIS — R921 Mammographic calcification found on diagnostic imaging of breast: Secondary | ICD-10-CM

## 2017-09-18 DIAGNOSIS — N644 Mastodynia: Secondary | ICD-10-CM

## 2017-09-24 ENCOUNTER — Other Ambulatory Visit: Payer: Self-pay | Admitting: Student in an Organized Health Care Education/Training Program

## 2017-09-24 ENCOUNTER — Telehealth: Payer: Self-pay | Admitting: Student in an Organized Health Care Education/Training Program

## 2017-09-24 DIAGNOSIS — N6310 Unspecified lump in the right breast, unspecified quadrant: Secondary | ICD-10-CM

## 2017-09-24 DIAGNOSIS — R921 Mammographic calcification found on diagnostic imaging of breast: Secondary | ICD-10-CM

## 2017-09-24 NOTE — Telephone Encounter (Signed)
Trish at [facility] calling for the biopsy order to be signed. Danley Danker, RN [facility] RN)

## 2017-09-24 NOTE — Telephone Encounter (Signed)
Pt has appt at 7:30 tomorrow morning at the [facility].  She got a referral from Urgent care for diagnotic but she needs a referral for a biospy.  Please send to fax  [phone] attn Cherish.

## 2017-09-25 ENCOUNTER — Inpatient Hospital Stay: Admission: RE | Admit: 2017-09-25 | Payer: Medicaid Other | Source: Ambulatory Visit

## 2017-09-27 ENCOUNTER — Telehealth (HOSPITAL_COMMUNITY): Payer: Self-pay

## 2017-09-27 NOTE — Telephone Encounter (Signed)
Pt requesting copy of labs, instructed on the need for form to be filled out at the front desk allowing [place] to do so. Pt verbalized understanding.

## 2017-09-29 NOTE — Telephone Encounter (Signed)
Signed form placed in fax box

## 2017-10-04 ENCOUNTER — Other Ambulatory Visit: Payer: Medicaid Other

## 2017-10-07 ENCOUNTER — Ambulatory Visit
Admission: RE | Admit: 2017-10-07 | Discharge: 2017-10-07 | Disposition: A | Discharge status: Home / Self Care | Payer: Medicaid Other | Source: Ambulatory Visit | Attending: Family Medicine | Admitting: Family Medicine

## 2017-10-07 ENCOUNTER — Ambulatory Visit
Admission: RE | Admit: 2017-10-07 | Discharge: 2017-10-07 | Disposition: A | Discharge status: Home / Self Care | Payer: Medicaid Other | Source: Ambulatory Visit | Attending: Emergency Medicine | Admitting: Emergency Medicine

## 2017-10-07 DIAGNOSIS — R921 Mammographic calcification found on diagnostic imaging of breast: Secondary | ICD-10-CM

## 2017-10-07 DIAGNOSIS — N6310 Unspecified lump in the right breast, unspecified quadrant: Secondary | ICD-10-CM

## 2017-10-08 ENCOUNTER — Telehealth: Payer: Self-pay | Admitting: Hematology

## 2017-10-08 NOTE — Telephone Encounter (Signed)
Spoke to patient to confirm afternoon [facility] appointment for 4/24, packet will be mailed to patient

## 2017-10-10 ENCOUNTER — Encounter: Payer: Self-pay | Admitting: *Deleted

## 2017-10-10 DIAGNOSIS — D0512 Intraductal carcinoma in situ of left breast: Secondary | ICD-10-CM | POA: Insufficient documentation

## 2017-10-14 ENCOUNTER — Encounter: Payer: Self-pay | Admitting: Genetics

## 2017-10-15 ENCOUNTER — Other Ambulatory Visit: Payer: Self-pay | Admitting: Hematology

## 2017-10-15 NOTE — Progress Notes (Signed)
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 Ultrasound guided biopsy of a 2 o'clock right breast mass. No apparent complications. Electronically Signed: By: Dorise Bullion III M.D On: 10/07/2017 14:56    ASSESSMENT & PLAN:  Amber Villarreal is 42 year old pre-menopausal woman, presented with screening discovered to DCIS.   1. Stage 0 Left Breast DCIS, high grade, ER/PR positve --We discussed her imaging findings and the biopsy results in great details. --She is not a strong candidate for breast conservation surgery due to her large area of DICS (18 cm). She has been seen by breast surgeon Dr. Excell Seltzer, who recommends left mastectomy. If she were to have lumpectomy, she can consider right breast reduction. If she were to have mastectomy she does want breast reconstruction surgery.  -Her DCIS will be cured by complete surgical resection. Any form of  adjuvant therapy is preventive.  The have high risk for second breast cancer in the future.  -We also discussed that biopsy may have sampling limitation, we will review her surgical path, to see if she has any invasive carcinoma components. -She was also seen by radiation oncologist Dr.  Kinard today. If she chooses mastectomy she will not need adjuvant Radiation Therapy. However, if she chooses to preserve her breast she would benefit from RT to reduce her risk of local recurrence.   -Given her young age, she is a candidate for Genetic testing. Referral made. She is interested and would like to proceed with this first.  -Giving her strongly ER and PR positivity of the tumor cells, and her pre-menopause status, I recommend adjuvant endocrine therapy with tamoxifen to prevent future breast cancer.  The potential side effects, which includes but not limited to, hot flash, skin and vaginal dryness, slightly increased risk of cardiovascular disease and cataract, small risk of thrombosis and endometrial cancer, were discussed with her in great details. Preventive strategies for thrombosis, such as being physically active, using compression stocks, avoid cigarette smoking, etc., were reviewed with her. I also recommend her to follow-up with her gynecologist once a year, and watch for vaginal spotting or bleeding, as a clinically sign of endometrial cancer, etc. She voiced good understanding, and she is interested. Plan to discuss more with her at our next visit --We also discussed the breast cancer surveillance after her surgery. She will continue annual screening mammogram, self exam, and a routine office visit with lab and exam with [place]. -I encouraged her to have healthy diet and exercise regularly -I discussed the [employer] study with her today and she is interested.  -At this time she wishes to get a second opinion at the [facility] before she makes any decisions. But she would like to proceed  with Genetic testing first.  -F/u open but after surgey  No orders of the defined types were placed in this encounter.  PLAN:  -Genetic refer -[employer] consent, lab today  -F/u after surgey  All questions were answered. The patient knows to call the clinic with any problems, questions or concerns. I spent 40 minutes counseling the patient face to face. The total time spent in the appointment was 45 minutes and more than 50% was on counseling.  This document serves as a record of services personally performed by Truitt Merle, MD. It was created on her behalf by Theresia Bough, a trained medical scribe. The creation of this record is based on the scribe's personal observations and the provider's statements to them.   I have reviewed the above documentation for accuracy and completeness, and I agree with the above.    Truitt Merle, MD 10/16/2017

## 2017-10-16 ENCOUNTER — Encounter: Payer: Self-pay | Admitting: Hematology

## 2017-10-16 ENCOUNTER — Ambulatory Visit
Admission: RE | Admit: 2017-10-16 | Discharge: 2017-10-16 | Disposition: A | Discharge status: Home / Self Care | Payer: Medicaid Other | Source: Ambulatory Visit | Attending: Radiation Oncology | Admitting: Radiation Oncology

## 2017-10-16 ENCOUNTER — Inpatient Hospital Stay: Payer: Medicaid Other

## 2017-10-16 ENCOUNTER — Inpatient Hospital Stay: Payer: Medicaid Other | Attending: Hematology | Admitting: Hematology

## 2017-10-16 VITALS — BP 160/98 | HR 82 | Temp 97.7°F | Resp 18 | Ht 70.0 in | Wt 207.0 lb

## 2017-10-16 DIAGNOSIS — D0512 Intraductal carcinoma in situ of left breast: Secondary | ICD-10-CM | POA: Diagnosis present

## 2017-10-16 DIAGNOSIS — D649 Anemia, unspecified: Secondary | ICD-10-CM | POA: Insufficient documentation

## 2017-10-16 DIAGNOSIS — F319 Bipolar disorder, unspecified: Secondary | ICD-10-CM | POA: Diagnosis not present

## 2017-10-16 DIAGNOSIS — Z8541 Personal history of malignant neoplasm of cervix uteri: Secondary | ICD-10-CM | POA: Diagnosis not present

## 2017-10-16 DIAGNOSIS — Z170 Estrogen receptor positive status [ER+]: Secondary | ICD-10-CM | POA: Insufficient documentation

## 2017-10-16 DIAGNOSIS — Z8042 Family history of malignant neoplasm of prostate: Secondary | ICD-10-CM | POA: Insufficient documentation

## 2017-10-16 DIAGNOSIS — Z809 Family history of malignant neoplasm, unspecified: Secondary | ICD-10-CM | POA: Insufficient documentation

## 2017-10-16 DIAGNOSIS — Z79899 Other long term (current) drug therapy: Secondary | ICD-10-CM | POA: Diagnosis not present

## 2017-10-16 DIAGNOSIS — Z87891 Personal history of nicotine dependence: Secondary | ICD-10-CM | POA: Diagnosis not present

## 2017-10-16 NOTE — Progress Notes (Signed)
Radiation Oncology         [phone] ________________________________  Multidisciplinary Breast Oncology Clinic [facility]) Initial Outpatient Consultation  Name: Amber Villarreal MRN: [id number]  Date: 10/16/2017  DOB: 11-24-1975  GB:TDVV, Ander Purpura, MD  Excell Seltzer, MD   REFERRING PHYSICIAN: Excell Seltzer, MD  DIAGNOSIS: The encounter diagnosis was Ductal carcinoma in situ (DCIS) of left breast. 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 Ductal carcinoma in situ (DCIS) of left breast D05.12     HISTORY OF PRESENT ILLNESS::Amber Villarreal is a 42 y.o. female who is presenting to the office today for evaluation of her newly diagnosed breast cancer. She initially noted a palpable abnormality in the UOQ of her right breast.   She underwent bilateral diagnostic mammography with tomography and right breast ultrasonography at [facility] on 09/18/2016 showing: Breast density category C. Large area of segmentally distributed pleomorphic calcifications within the upper-outer left breast extending from the posterior aspect of the breast to the nipple. Suspicious palpable right breast mass.  Accordingly on 10/07/2017 she proceeded to biopsy of the left breast area in question. The pathology from this procedure showed: Breast, left, needle core biopsy, upper outer posterior calcs with ductal carcinoma in situ (DCIS), high grade with calcifications. Negative for invasive carcinoma. Breast, left, needle core biopsy, upper outer anterior calcs with ductal carcinoma in situ (DCIS), intermediate grade with calcifications. Negative for invasive carcinoma. Breast, right, needle core biopsy, 2 o'clock mass with benign breast tissue with fibrocystic change, including adenosis. Negative for carcinoma. Prognostic indicators significant for: estrogen receptor, 95% positive and progesterone  receptor, 90% positive, both with strong staining intensity.    Menarche: 42 years old Age at first live birth: 42 years old GP: GxP4 LMP: She is still having menstrual cycles Contraceptive: last 15 years off and on HRT: No   The patient was referred today for presentation in the multidisciplinary conference.  Radiology studies and pathology slides were presented there for review and discussion of treatment options.  A consensus was discussed regarding potential next steps.  PREVIOUS RADIATION THERAPY: No  PAST MEDICAL HISTORY:  has a past medical history of Anemia, Bipolar disorder, unspecified ([place]), cardiovascular stress test, and Opioid type dependence, unspecified.    PAST SURGICAL HISTORY: Past Surgical History:  Procedure Laterality Date  . DILATION AND CURETTAGE OF UTERUS      FAMILY HISTORY: family history includes Cancer in her mother; Colon cancer in her maternal uncle; Congestive Heart Failure in her mother; Coronary artery disease in her maternal aunt, mother, and unknown relative; Diabetes in her mother; Heart disease (age of onset: 6) in her mother; Hypertension in her mother and sister; Prostate cancer in her brother and maternal uncle; Stroke in her mother.  SOCIAL HISTORY:  reports that she quit smoking about 9 years ago. She has a 7.50 pack-year smoking history. She has never used smokeless tobacco. She reports that she drinks about 4.2 oz of alcohol per week. She reports that she does not use drugs.  ALLERGIES: Prednisone and Shellfish allergy  MEDICATIONS:  Current Outpatient Medications  Medication Sig Dispense Refill  . albuterol (PROVENTIL HFA;VENTOLIN HFA) 108 (90 Base) MCG/ACT inhaler Inhale 2 puffs into the lungs every 6 (six) hours as needed for wheezing or shortness of breath. (Patient not taking: Reported on 10/16/2017) 1 Inhaler 0  . aspirin-acetaminophen-caffeine (EXCEDRIN MIGRAINE) 250-250-65 MG tablet  Take 1 tablet by mouth every 6 (six) hours as  needed for headache.    . ibuprofen (ADVIL,MOTRIN) 600 MG tablet Take 1 tablet (600 mg total) by mouth every 6 (six) hours as needed. (Patient not taking: Reported on 10/16/2017) 30 tablet 0  . Multiple Vitamin (MULTIVITAMIN WITH MINERALS) TABS tablet Take 1 tablet by mouth daily.    . pantoprazole (PROTONIX) 40 MG tablet Take 1 tablet (40 mg total) by mouth daily. (Patient not taking: Reported on 10/16/2017) 30 tablet 2   No current facility-administered medications for this encounter.     REVIEW OF SYSTEMS:  REVIEW OF SYSTEMS: A 10+ POINT REVIEW OF SYSTEMS WAS OBTAINED including neurology, dermatology, psychiatry, cardiac, respiratory, lymph, extremities, GI, GU, musculoskeletal, constitutional, reproductive, HEENT. All pertinent positives are noted in the HPI. All others are negative.   PHYSICAL EXAM:  Vitals with BMI 10/16/2017  Height '5\' 10"'$   Weight 207 lbs  BMI 02.5  Systolic 852  Diastolic 98  Pulse 82  Respirations 18    General: Alert and oriented, in no acute distress HEENT: Head is normocephalic. Extraocular movements are intact. Oropharynx is clear. Neck: Neck is supple, no palpable cervical or supraclavicular lymphadenopathy. Heart: Regular in rate and rhythm with no murmurs, rubs, or gallops. Chest: Clear to auscultation bilaterally, with no rhonchi, wheezes, or rales. Abdomen: Soft, nontender, nondistended, with no rigidity or guarding. Extremities: No cyanosis or edema. Lymphatics: see Neck Exam Skin: No concerning lesions. Musculoskeletal: symmetric strength and muscle tone throughout. Neurologic: Cranial nerves II through XII are grossly intact. No obvious focalities. Speech is fluent. Coordination is intact. Psychiatric: Judgment and insight are intact. Affect is appropriate. Breast: Right breast extremely large and pendulous without mass or nipple discharge. Some bruising in the medial aspect of the right breast from recent biopsy. Left breast, patient has extensive  bruising in the lateral aspect of the breast. This breast is extremely large and pendulous. There is a palpable area of induration adjacent to the areolar border in the lateral border measuring approximately 5 x 6 cm in size.    KPS = 100  100 - Normal; no complaints; no evidence of disease. 90   - Able to carry on normal activity; minor signs or symptoms of disease. 80   - Normal activity with effort; some signs or symptoms of disease. 78   - Cares for self; unable to carry on normal activity or to do active work. 60   - Requires occasional assistance, but is able to care for most of his personal needs. 50   - Requires considerable assistance and frequent medical care. 20   - Disabled; requires special care and assistance. 37   - Severely disabled; hospital admission is indicated although death not imminent. 68   - Very sick; hospital admission necessary; active supportive treatment necessary. 10   - Moribund; fatal processes progressing rapidly. 0     - Dead  Karnofsky DA, Abelmann [place], Craver LS and Burchenal [facility] [phone]) The use of the nitrogen mustards in the palliative treatment of carcinoma: with particular reference to bronchogenic carcinoma Cancer 1 634-56  LABORATORY DATA:  Lab Results  Component Value Date   WBC 8.6 12/12/2016   HGB 13.0 12/12/2016   HCT 38.7 12/12/2016   MCV 95 12/12/2016   PLT 307 12/12/2016   Lab Results  Component Value Date   NA 142 12/12/2016   K 4.2 12/12/2016   CL 108 (H) 12/12/2016   CO2 20 12/12/2016   Lab  Results  Component Value Date   ALT 16 12/12/2016   AST 14 12/12/2016   ALKPHOS 58 12/12/2016   BILITOT <0.2 12/12/2016    PULMONARY FUNCTION TEST:   Recent Review Flowsheet Data    There is no flowsheet data to display.      RADIOGRAPHY: US Breast Ltd Uni Right Inc Axilla  Result Date: 09/18/2017 CLINICAL DATA:  Patient presents for palpable abnormality within the upper-outer right breast. EXAM: DIGITAL DIAGNOSTIC BILATERAL  MAMMOGRAM WITH CAD AND TOMO ULTRASOUND RIGHT BREAST COMPARISON:  Previous exam(s). ACR Breast Density Category c: The breast tissue is heterogeneously dense, which may obscure small masses. FINDINGS: Interval development of segmental pleomorphic calcifications throughout the upper outer left breast extending from the posterior breast anteriorly toward the nipple. These measure approximately 18 cm in length. There is a small irregular mass right breast underlying the palpable marker demonstrated best on the tangential view. No additional concerning masses, calcifications or distortion identified. Mammographic images were processed with CAD. On physical exam, I palpate a small mobile mass within the upper-outer right breast. Targeted ultrasound is performed, showing a 6 x 3 x 8 mm irregular hypoechoic mass right breast 2 o'clock position 7 cm from nipple. No right axillary adenopathy. IMPRESSION: 1. Large area of segmentally distributed pleomorphic calcifications within the upper-outer left breast extending from the posterior aspect of the breast to the nipple. 2. Suspicious palpable right breast mass. RECOMMENDATION: 1. Stereotactic guided core needle biopsy of the posterior and anterior extent of the segmentally distributed suspicious calcifications lateral left breast. 2. Ultrasound-guided core needle biopsy suspicious right breast mass. I have discussed the findings and recommendations with the patient. Results were also provided in writing at the conclusion of the visit. If applicable, a reminder letter will be sent to the patient regarding the next appointment. BI-RADS CATEGORY  4: Suspicious. Electronically Signed   By: Lovey Newcomer M.D.   On: 09/18/2017 16:31   Mm Diag Breast Tomo Bilateral  Result Date: 09/18/2017 CLINICAL DATA:  Patient presents for palpable abnormality within the upper-outer right breast. EXAM: DIGITAL DIAGNOSTIC BILATERAL MAMMOGRAM WITH CAD AND TOMO ULTRASOUND RIGHT BREAST COMPARISON:   Previous exam(s). ACR Breast Density Category c: The breast tissue is heterogeneously dense, which may obscure small masses. FINDINGS: Interval development of segmental pleomorphic calcifications throughout the upper outer left breast extending from the posterior breast anteriorly toward the nipple. These measure approximately 18 cm in length. There is a small irregular mass right breast underlying the palpable marker demonstrated best on the tangential view. No additional concerning masses, calcifications or distortion identified. Mammographic images were processed with CAD. On physical exam, I palpate a small mobile mass within the upper-outer right breast. Targeted ultrasound is performed, showing a 6 x 3 x 8 mm irregular hypoechoic mass right breast 2 o'clock position 7 cm from nipple. No right axillary adenopathy. IMPRESSION: 1. Large area of segmentally distributed pleomorphic calcifications within the upper-outer left breast extending from the posterior aspect of the breast to the nipple. 2. Suspicious palpable right breast mass. RECOMMENDATION: 1. Stereotactic guided core needle biopsy of the posterior and anterior extent of the segmentally distributed suspicious calcifications lateral left breast. 2. Ultrasound-guided core needle biopsy suspicious right breast mass. I have discussed the findings and recommendations with the patient. Results were also provided in writing at the conclusion of the visit. If applicable, a reminder letter will be sent to the patient regarding the next appointment. BI-RADS CATEGORY  4: Suspicious. Electronically Signed   By:  Lovey Newcomer M.D.   On: 09/18/2017 16:31   Mm Clip Placement Left  Result Date: 10/07/2017 CLINICAL DATA:  Evaluate biopsy marker EXAM: DIAGNOSTIC LEFT MAMMOGRAM POST STEREOTACTIC BIOPSY COMPARISON:  Previous exam(s). FINDINGS: Mammographic images were obtained following stereotactic guided biopsy of left breast calcifications. The coil shaped clip is at  the site of the posterior calcification biopsy. The X shaped clip is at the site of the anteriorly biopsied calcifications. IMPRESSION: Clip placement as above. The biopsy clips are 12 cm apart. Of note, the calcifications span 18 cm on the recent diagnostic mammogram. Final Assessment: Post Procedure Mammograms for Marker Placement Electronically Signed   By: Dorise Bullion III M.D   On: 10/07/2017 15:18   Mm Clip Placement Right  Result Date: 10/07/2017 CLINICAL DATA:  Evaluate biopsy marker placement EXAM: DIAGNOSTIC RIGHT MAMMOGRAM POST ULTRASOUND BIOPSY COMPARISON:  Previous exam(s). FINDINGS: Mammographic images were obtained following ultrasound guided biopsy of a right breast mass. The ribbon shaped biopsy clip is in good position. IMPRESSION: Appropriate clip placement as above. Final Assessment: Post Procedure Mammograms for Marker Placement Electronically Signed   By: Dorise Bullion III M.D   On: 10/07/2017 15:15   Mm Lt Breast Bx Johnella Moloney Dev 1st Lesion Image Bx Spec Stereo Guide  Addendum Date: 10/10/2017   ADDENDUM REPORT: 10/09/2017 14:56 ADDENDUM: Pathology revealed HIGH GRADE WITH CALCIFICATIONS- DUCTAL CARCINOMA IN SITU (DCIS) of LEFT breast, upper outer posterior calcs . Pathology revealed INTERMEDIATE GRADE WITH CALCIFICATIONS - DUCTAL CARCINOMA IN SITU (DCIS), of LEFT breast, upper outer anterior calcs. Pathology revealed FIBROCYSTIC CHANGE, INCLUDING ADENOSIS of RIGHT breast, 2 o'clock. This was found to be concordant by Dr. Dorise Bullion. Pathology results were discussed with the patient by telephone. The patient reported doing well after the biopsy with tenderness at the site. Post biopsy instructions and care were reviewed and questions were answered. The patient was encouraged to call [facility] for any additional concerns. The patient was referred to [facility] at [facility] on October 16, 2017. Pathology results reported by Roselind Messier, RN on 10/09/2017 Electronically Signed   By: Dorise Bullion III M.D   On: 10/09/2017 14:56   Result Date: 10/10/2017 CLINICAL DATA:  Biopsy of left breast calcifications, posteriorly, and anteriorly. EXAM: LEFT BREAST STEREOTACTIC CORE NEEDLE BIOPSY COMPARISON:  Previous exams. FINDINGS: The patient and I discussed the procedure of stereotactic-guided biopsy including benefits and alternatives. We discussed the high likelihood of a successful procedure. We discussed the risks of the procedure including infection, bleeding, tissue injury, clip migration, and inadequate sampling. Informed written consent was given. The usual time out protocol was performed immediately prior to the procedure. Using sterile technique and 1% Lidocaine as local anesthetic, under stereotactic guidance, a 9 gauge vacuum assisted device was used to perform core needle biopsy of calcifications in the upper outer left breast at a posterior depth using a superior approach. Specimen radiograph was performed showing calcifications in 6 out of 7 core specimens. Specimens with calcifications are identified for pathology. Lesion quadrant: Upper-outer At the conclusion of the procedure, a tissue marker clip was deployed into the biopsy cavity. Follow-up 2-view mammogram was performed and dictated separately. Using sterile technique and 1% Lidocaine as local anesthetic, under stereotactic guidance, a 9 gauge vacuum assisted device was used to perform core needle biopsy of calcifications in the upper outer anterior left breast using a superior approach. Specimen radiograph was performed showing  calcifications in 3 out of 28 specimens. Specimens with calcifications are identified for pathology. Lesion quadrant: Upper-outer At the conclusion of the procedure, a tissue marker clip was deployed into the biopsy cavity. Follow-up 2-view mammogram was performed and dictated separately. IMPRESSION:  Stereotactic-guided biopsy of the anterior and posterior aspects of the left breast calcifications. No apparent complications. Electronically Signed: By: Dorise Bullion III M.D On: 10/07/2017 14:07   Mm Lt Breast Bx W Loc Dev Ea Ad Lesion Img Bx Spec Stereo Guide  Addendum Date: 10/10/2017   ADDENDUM REPORT: 10/09/2017 14:56 ADDENDUM: Pathology revealed HIGH GRADE WITH CALCIFICATIONS- DUCTAL CARCINOMA IN SITU (DCIS) of LEFT breast, upper outer posterior calcs . Pathology revealed INTERMEDIATE GRADE WITH CALCIFICATIONS - DUCTAL CARCINOMA IN SITU (DCIS), of LEFT breast, upper outer anterior calcs. Pathology revealed FIBROCYSTIC CHANGE, INCLUDING ADENOSIS of RIGHT breast, 2 o'clock. This was found to be concordant by Dr. Dorise Bullion. Pathology results were discussed with the patient by telephone. The patient reported doing well after the biopsy with tenderness at the site. Post biopsy instructions and care were reviewed and questions were answered. The patient was encouraged to call [facility] for any additional concerns. The patient was referred to [facility] on October 16, 2017. Pathology results reported by Roselind Messier, RN on 10/09/2017 Electronically Signed   By: Dorise Bullion III M.D   On: 10/09/2017 14:56   Result Date: 10/10/2017 CLINICAL DATA:  Biopsy of left breast calcifications, posteriorly, and anteriorly. EXAM: LEFT BREAST STEREOTACTIC CORE NEEDLE BIOPSY COMPARISON:  Previous exams. FINDINGS: The patient and I discussed the procedure of stereotactic-guided biopsy including benefits and alternatives. We discussed the high likelihood of a successful procedure. We discussed the risks of the procedure including infection, bleeding, tissue injury, clip migration, and inadequate sampling. Informed written consent was given. The usual time out protocol was performed immediately prior to the  procedure. Using sterile technique and 1% Lidocaine as local anesthetic, under stereotactic guidance, a 9 gauge vacuum assisted device was used to perform core needle biopsy of calcifications in the upper outer left breast at a posterior depth using a superior approach. Specimen radiograph was performed showing calcifications in 6 out of 7 core specimens. Specimens with calcifications are identified for pathology. Lesion quadrant: Upper-outer At the conclusion of the procedure, a tissue marker clip was deployed into the biopsy cavity. Follow-up 2-view mammogram was performed and dictated separately. Using sterile technique and 1% Lidocaine as local anesthetic, under stereotactic guidance, a 9 gauge vacuum assisted device was used to perform core needle biopsy of calcifications in the upper outer anterior left breast using a superior approach. Specimen radiograph was performed showing calcifications in 3 out of 28 specimens. Specimens with calcifications are identified for pathology. Lesion quadrant: Upper-outer At the conclusion of the procedure, a tissue marker clip was deployed into the biopsy cavity. Follow-up 2-view mammogram was performed and dictated separately. IMPRESSION: Stereotactic-guided biopsy of the anterior and posterior aspects of the left breast calcifications. No apparent complications. Electronically Signed: By: Dorise Bullion III M.D On: 10/07/2017 14:07   [place] Rt Breast Bx W Loc Dev 1st Lesion Img Bx Spec US Guide  Addendum Date: 10/10/2017   ADDENDUM REPORT: 10/09/2017 14:54 ADDENDUM: Pathology revealed HIGH GRADE WITH CALCIFICATIONS- DUCTAL CARCINOMA IN SITU (DCIS) of LEFT breast, upper outer posterior calcs . Pathology revealed INTERMEDIATE GRADE WITH CALCIFICATIONS - DUCTAL CARCINOMA IN SITU (DCIS), of LEFT breast, upper outer  anterior calcs. Pathology revealed FIBROCYSTIC CHANGE, INCLUDING ADENOSIS of RIGHT breast, 2 o'clock. This was found to be concordant by Dr. Dorise Bullion.  Pathology results were discussed with the patient by telephone. The patient reported doing well after the biopsy with tenderness at the site. Post biopsy instructions and care were reviewed and questions were answered. The patient was encouraged to call [facility] for any additional concerns. The patient was referred to [facility] on October 16, 2017. Pathology results reported by Roselind Messier, RN on 10/09/2017. Electronically Signed   By: Dorise Bullion III M.D   On: 10/09/2017 14:54   Result Date: 10/10/2017 CLINICAL DATA:  Right breast mass biopsy EXAM: ULTRASOUND GUIDED RIGHT BREAST CORE NEEDLE BIOPSY COMPARISON:  Previous exam(s). FINDINGS: I met with the patient and we discussed the procedure of ultrasound-guided biopsy, including benefits and alternatives. We discussed the high likelihood of a successful procedure. We discussed the risks of the procedure, including infection, bleeding, tissue injury, clip migration, and inadequate sampling. Informed written consent was given. The usual time-out protocol was performed immediately prior to the procedure. Lesion quadrant: Upper inner Using sterile technique and 1% Lidocaine as local anesthetic, under direct ultrasound visualization, a 12 gauge spring-loaded device was used to perform biopsy of a 2 o'clock right breast mass using a lateral approach. At the conclusion of the procedure a tissue marker clip was deployed into the biopsy cavity. Follow up 2 view mammogram was performed and dictated separately. IMPRESSION: Ultrasound guided biopsy of a 2 o'clock right breast mass. No apparent complications. 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 It would be very difficult to get a reasonable cosmetic result given this extent of involvement, therefore, we will recommend a mastectomy as her definitive treatment.   Patient is very interested in breast conserving surgey and may seek out a second opinion prior to her planned surgery.   We discussed the general indication for post-mastectomy radiation therapy being, margin status, tumor size, and lymph node positivity.    PLAN:  1. Left mastectomy with sentinel node procedure if patient agrees 2. Plastic surgery consult on 10/23/2017 with Dr. Iran Planas  3. Radiation decision pending final results from her surgery. 4. Tamoxifen     ------------------------------------------------  Blair Promise, PhD, MD   This document serves as a record of services personally performed by Gery Pray, MD. It was created on his behalf by [facility], a trained medical scribe. The creation of this record is based on the scribe's personal observations and the provider's statements to them. This document has been checked and approved by the attending provider.

## 2017-10-17 ENCOUNTER — Telehealth: Payer: Self-pay | Admitting: Hematology

## 2017-10-17 ENCOUNTER — Other Ambulatory Visit: Payer: Medicaid Other

## 2017-10-17 NOTE — Telephone Encounter (Signed)
No LOS 4/24 °

## 2017-10-21 ENCOUNTER — Telehealth: Payer: Self-pay | Admitting: Hematology

## 2017-10-21 NOTE — Telephone Encounter (Signed)
Spoke with patient to confirm genetics appointment for 5/15

## 2017-10-22 ENCOUNTER — Encounter: Payer: Self-pay | Admitting: Plastic Surgery

## 2017-10-23 ENCOUNTER — Telehealth: Payer: Self-pay | Admitting: *Deleted

## 2017-10-23 NOTE — Telephone Encounter (Signed)
  Oncology Nurse Navigator Documentation  Navigator Location: [facility] (10/23/17 1200)   )Navigator Encounter Type: Telephone;MDC Follow-up (10/23/17 1200) Telephone: Outgoing Call;Clinic/MDC Follow-up (10/23/17 1200)                                                  Time Spent with Patient: 15 (10/23/17 1200)

## 2017-11-06 ENCOUNTER — Encounter: Payer: Self-pay | Admitting: Genetic Counselor

## 2017-11-06 ENCOUNTER — Inpatient Hospital Stay: Payer: Medicaid Other

## 2017-11-06 ENCOUNTER — Inpatient Hospital Stay: Payer: Medicaid Other | Attending: Hematology | Admitting: Genetic Counselor

## 2017-11-06 DIAGNOSIS — Z8042 Family history of malignant neoplasm of prostate: Secondary | ICD-10-CM | POA: Insufficient documentation

## 2017-11-06 DIAGNOSIS — D0512 Intraductal carcinoma in situ of left breast: Secondary | ICD-10-CM

## 2017-11-06 DIAGNOSIS — Z7183 Encounter for nonprocreative genetic counseling: Secondary | ICD-10-CM
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
## 2017-11-12 ENCOUNTER — Ambulatory Visit: Payer: Self-pay | Admitting: Genetic Counselor

## 2017-11-12 ENCOUNTER — Telehealth: Payer: Self-pay | Admitting: Genetic Counselor

## 2017-11-12 ENCOUNTER — Encounter: Payer: Self-pay | Admitting: Genetic Counselor

## 2017-11-12 DIAGNOSIS — D0512 Intraductal carcinoma in situ of left breast: Secondary | ICD-10-CM

## 2017-11-12 DIAGNOSIS — Z8042 Family history of malignant neoplasm of prostate: Secondary | ICD-10-CM

## 2017-11-12 DIAGNOSIS — Z1379 Encounter for other screening for genetic and chromosomal anomalies: Secondary | ICD-10-CM | POA: Insufficient documentation

## 2017-11-12 NOTE — Telephone Encounter (Signed)
Revealed negative genetic testing.  Discussed that we do not know why she has breast cancer or why there is cancer in the family. It could be due to a different gene that we are not testing, or maybe our current technology may not be able to pick something up.  It will be important for her to keep in contact with genetics to keep up with whether additional testing may be needed. 

## 2017-11-12 NOTE — Progress Notes (Signed)
HPI:  [name] was previously seen in the [place] clinic due to a personal and family history of cancer and concerns regarding a hereditary predisposition to cancer. Please refer to our prior cancer genetics clinic note for more information regarding [name]'s medical, social and family histories, and our assessment and recommendations, at the time. [name] recent genetic test results were disclosed to her, as were recommendations warranted by these results. These results and recommendations are discussed in more detail below.  CANCER HISTORY:  Oncology History   Cancer Staging Ductal carcinoma in situ (DCIS) of left breast Staging form: Breast, AJCC 8th Edition - Clinical stage from 10/07/2017: Stage 0 (cTis (DCIS), cN0, cM0, ER+, PR+, HER2: Not Assessed) - Signed by Truitt Merle, MD on 10/16/2017       Ductal carcinoma in situ (DCIS) of left breast   09/18/2017 Mammogram    IMPRESSION: 1. Large area of segmentally distributed pleomorphic calcifications within the upper-outer left breast extending from the posterior aspect of the breast to the nipple. 2. Suspicious palpable right breast mass.      10/07/2017 Receptors her2    Prognostic indicators significant for: ER, 95% positive and PR, 90% positive, both with strong staining intensity       10/07/2017 Initial Biopsy    Diagnosis 10/07/17 1. Breast, left, needle core biopsy, upper outer posterior calcs - DUCTAL CARCINOMA IN SITU (DCIS), HIGH GRADE WITH CALCIFICATIONS. SEE NOTE. - NEGATIVE FOR INVASIVE CARCINOMA. 2. Breast, left, needle core biopsy, upper outer anterior calcs - DUCTAL CARCINOMA IN SITU (DCIS), INTERMEDIATE GRADE WITH CALCIFICATIONS. SEE NOTE. - NEGATIVE FOR INVASIVE CARCINOMA. 3. Breast, right, needle core biopsy, 2 o'clock mass - BENIGN BREAST TISSUE WITH FIBROCYSTIC CHANGE, INCLUDING ADENOSIS. - NEGATIVE FOR CARCINOMA.      10/07/2017 Cancer Staging    Staging form: Breast, AJCC 8th  Edition - Clinical stage from 10/07/2017: Stage 0 (cTis (DCIS), cN0, cM0, ER+, PR+, HER2: Not Assessed) - Signed by Truitt Merle, MD on 10/16/2017      10/10/2017 Initial Diagnosis    Ductal carcinoma in situ (DCIS) of left breast      11/12/2017 Genetic Testing    Negative genetic testing on the STAT and hereditary cancer panel.  The Hereditary Gene Panel offered by [facility] includes sequencing and/or deletion duplication testing of the following 47 genes: APC, ATM, AXIN2, BARD1, BMPR1A, BRCA1, BRCA2, BRIP1, CDH1, CDK4, CDKN2A (p14ARF), CDKN2A (p16INK4a), CHEK2, CTNNA1, DICER1, EPCAM (Deletion/duplication testing only), GREM1 (promoter region deletion/duplication testing only), KIT, MEN1, MLH1, MSH2, MSH3, MSH6, MUTYH, NBN, NF1, NHTL1, PALB2, PDGFRA, PMS2, POLD1, POLE, PTEN, RAD50, RAD51C, RAD51D, SDHB, SDHC, SDHD, SMAD4, SMARCA4. STK11, TP53, TSC1, TSC2, and VHL.  The following genes were evaluated for sequence changes only: SDHA and HOXB13 c.251G>A variant only. The report date is Nov 12, 2017.       FAMILY HISTORY:  We obtained a detailed, 4-generation family history.  Significant diagnoses are listed below: Family History  Problem Relation Age of Onset  . Heart disease Mother 55       bypass  . Diabetes Mother   . Hypertension Mother   . Cancer Mother        vaginal, vs cervical, vs ovarian dx in her 36s  . Congestive Heart Failure Mother        early 36's  . Stroke Mother   . Coronary artery disease Mother   . Hypertension Sister   . Coronary artery disease Unknown   . Coronary artery  disease Maternal Aunt   . Colon cancer Maternal Uncle   . Prostate cancer Maternal Uncle     The patient has four children, three boys and one girl, all who are cancer free.  She has a full younger brother and two maternal half siblings, a sister and brother.  All are cancer free.  Her mother is deceased and her father is living.  She has only known her father for a couple years.  She states that he  seems healthy, does not have a diagnosis of cancer.  She is not familiar with all of his family members, but does not think that there is a history of cancer.  The patient's mother died at 69 from congestive heart failure.  She reportedly had a "female cancer" in her 53's.  Her mother had many siblings, one who had prostate cancer and died.  There is no other reported family history of cancer on the maternal side.  [name] is unaware of previous family history of genetic testing for hereditary cancer risks. Patient's maternal ancestors are of African American descent, and paternal ancestors are of African American descent. There is no reported Ashkenazi Jewish ancestry. There is no known consanguinity.  GENETIC TEST RESULTS: Genetic testing reported out on Nov 12, 2017 through the Hereditary common cancer panel found no deleterious mutations.  The Hereditary Gene Panel offered by [facility] includes sequencing and/or deletion duplication testing of the following 47 genes: APC, ATM, AXIN2, BARD1, BMPR1A, BRCA1, BRCA2, BRIP1, CDH1, CDK4, CDKN2A (p14ARF), CDKN2A (p16INK4a), CHEK2, CTNNA1, DICER1, EPCAM (Deletion/duplication testing only), GREM1 (promoter region deletion/duplication testing only), KIT, MEN1, MLH1, MSH2, MSH3, MSH6, MUTYH, NBN, NF1, NHTL1, PALB2, PDGFRA, PMS2, POLD1, POLE, PTEN, RAD50, RAD51C, RAD51D, SDHB, SDHC, SDHD, SMAD4, SMARCA4. STK11, TP53, TSC1, TSC2, and VHL.  The following genes were evaluated for sequence changes only: SDHA and HOXB13 c.251G>A variant only.   The test report has been scanned into EPIC and is located under the Molecular Pathology section of the Results Review tab.    We discussed with [name] that since the current genetic testing is not perfect, it is possible there may be a gene mutation in one of these genes that current testing cannot detect, but that chance is small.  We also discussed, that it is possible that another gene that has not yet been discovered, or  that we have not yet tested, is responsible for the cancer diagnoses in the family, and it is, therefore, important to remain in touch with cancer genetics in the future so that we can continue to offer [name] the most up to date genetic testing.     CANCER SCREENING RECOMMENDATIONS: This result is reassuring and indicates that [name] likely does not have an increased risk for a future cancer due to a mutation in one of these genes. This normal test also suggests that Amber Villarreal cancer was most likely not due to an inherited predisposition associated with one of these genes.  Most cancers happen by chance and this negative test suggests that her cancer falls into this category.  We, therefore, recommended she continue to follow the cancer management and screening guidelines provided by her oncology and primary healthcare provider.   An individual's cancer risk and medical management are not determined by genetic test results alone. Overall cancer risk assessment incorporates additional factors, including personal medical history, family history, and any available genetic information that may result in a personalized plan for cancer prevention and surveillance.  RECOMMENDATIONS FOR  FAMILY MEMBERS:  Women in this family might be at some increased risk of developing cancer, over the general population risk, simply due to the family history of cancer.  We recommended women in this family have a yearly mammogram beginning at age 24, or 63 years younger than the earliest onset of cancer, an annual clinical breast exam, and perform monthly breast self-exams. Women in this family should also have a gynecological exam as recommended by their primary provider. All family members should have a colonoscopy by age 24.  FOLLOW-UP: Lastly, we discussed with [name] that cancer genetics is a rapidly advancing field and it is possible that new genetic tests will be appropriate for her and/or her family members  in the future. We encouraged her to remain in contact with cancer genetics on an annual basis so we can update her personal and family histories and let her know of advances in cancer genetics that may benefit this family.   Our contact number was provided. [name] questions were answered to her satisfaction, and she knows she is welcome to call us at anytime with additional questions or concerns.   Roma Kayser, MS, [facility] Certified Genetic Counselor [email or web address]

## 2018-02-19 ENCOUNTER — Encounter: Payer: Self-pay | Admitting: *Deleted

## 2018-02-19 NOTE — Progress Notes (Signed)
[place] Work  Holiday representative received referral from [employer] after patient called requesting assistance with food for her children.  CSW contacted patient at home to offer support and assess for needs.  Patient stated she recently had surgery at [facility].  Patient started that she uses her child support payments to by groceries, but due to late payments she was needing assistance with food for her children.  CSW explored food assistance resources in the community including [employer], [employer], churches, and food banks.  CSW offered to mail patient information on resources, which patient declined.  CSW also encouraged patient to contact the school social worker for each child as they may have access to food assistance programs through [employer].  CSW also informed patient of the [facility] food pantry, but that the patient would need to come to [facility] to pick up a food bag.  Patient declined all services.              Johnnye Lana, MSW, LCSW, OSW-C Clinical Social Worker [facility] [phone]

## 2018-04-15 HISTORY — PX: MASTECTOMY: SHX3

## 2018-10-03 IMAGING — CT CT HEAD W/O CM
2 of 4 series · 14 of 47 positions shown, 17 images · non-contrast
Comparison: CT of the head performed 12/23/2015

CLINICAL DATA: Acute onset of right-sided headache and blurred
vision. Tingling in the feet and legs. Initial encounter.

EXAM:
CT HEAD WITHOUT CONTRAST
TECHNIQUE: Contiguous axial images were obtained from the base of the skull
through the vertex without intravenous contrast.

[Series 2: head w/o · axial · non-contrast · 0.45mm/px · z∈[-149,-29]mm · 11 of 30 slices shown, 14 images]
[im 3/30  brain]
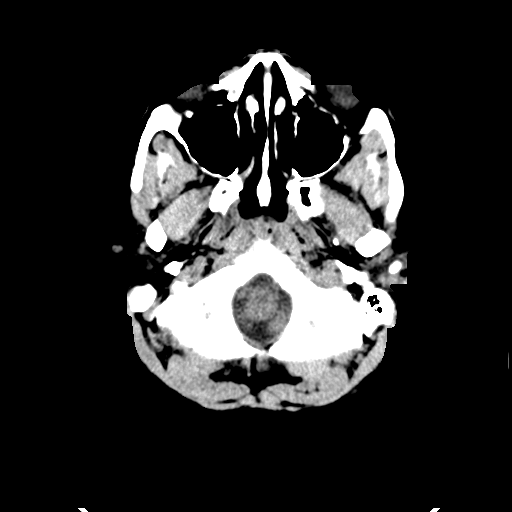
[im 3/30  bone]
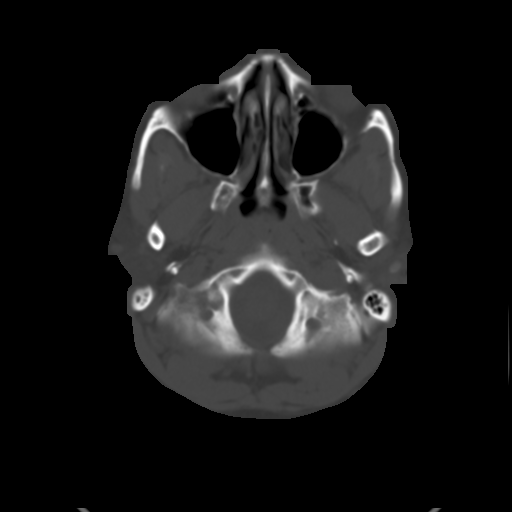
[im 5/30  brain]
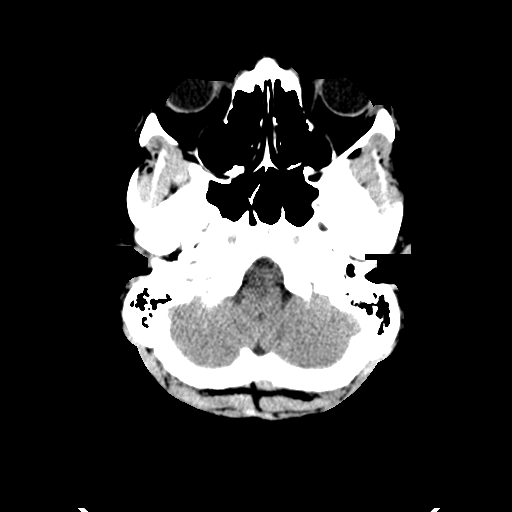
[im 7/30  brain]
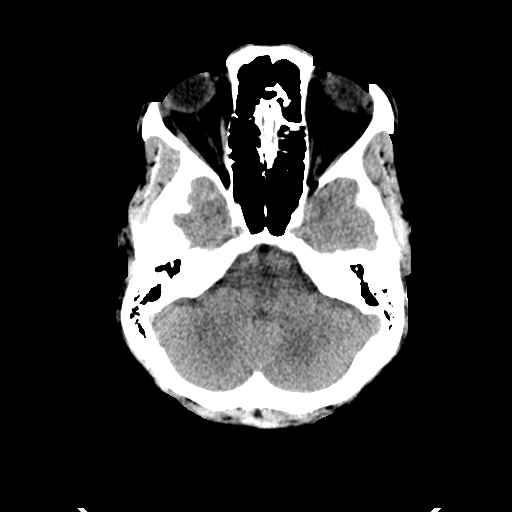
[im 11/30  brain]
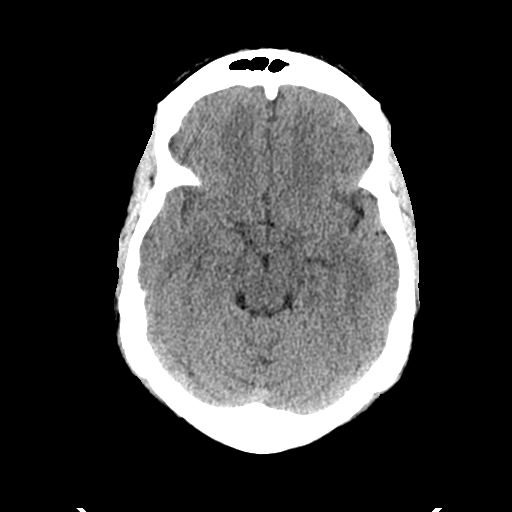
[im 13/30  brain]
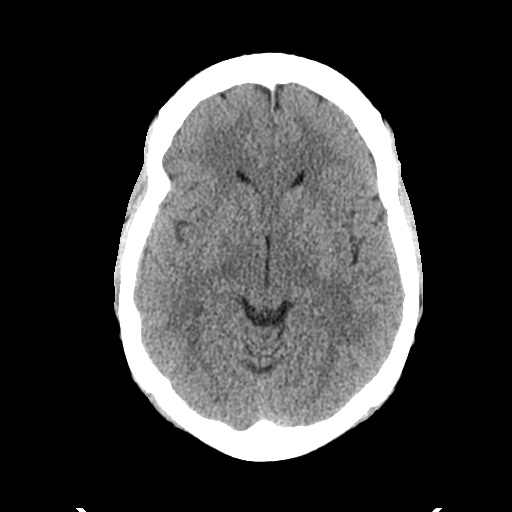
[im 13/30  bone]
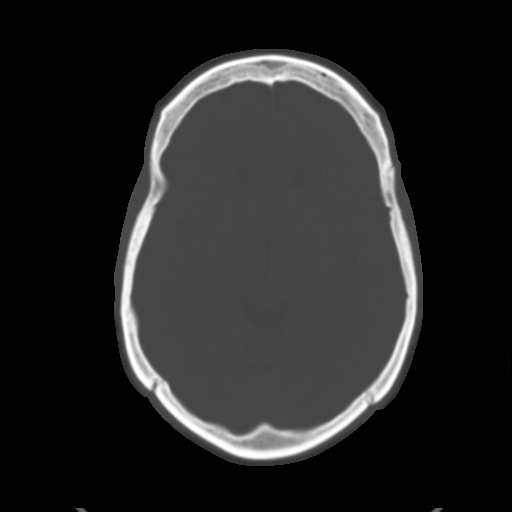
[im 15/30  brain]
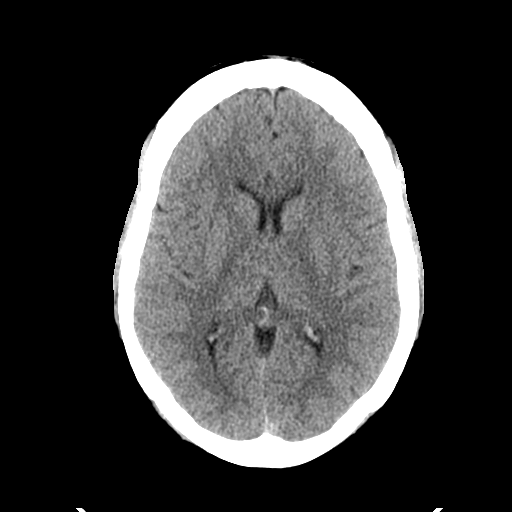
[im 17/30  brain]
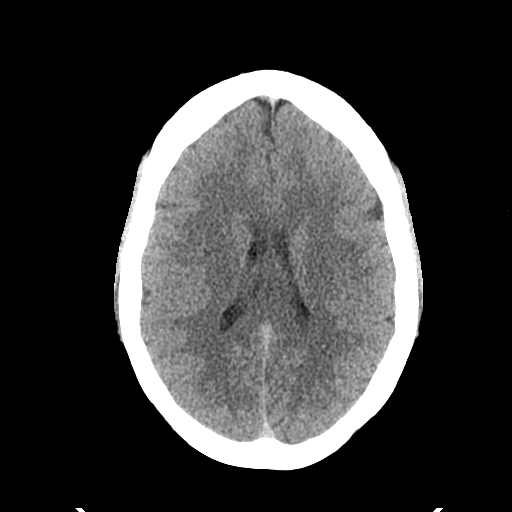
[im 19/30  brain]
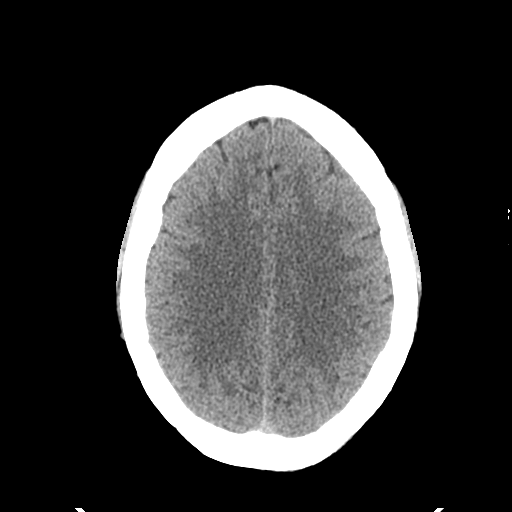
[im 23/30  brain]
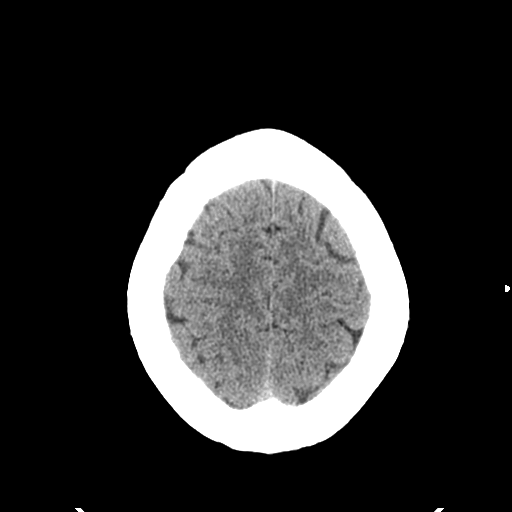
[im 23/30  bone]
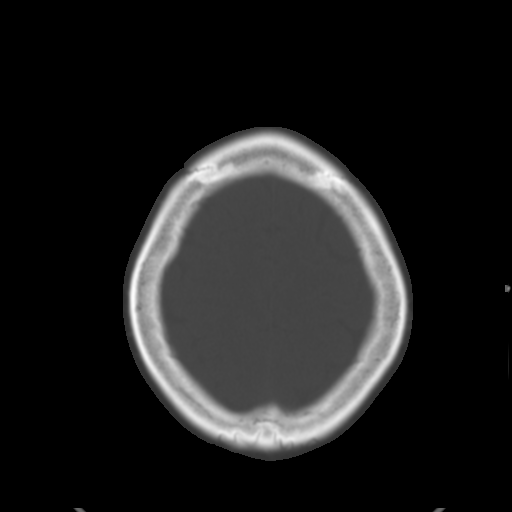
[im 25/30  brain]
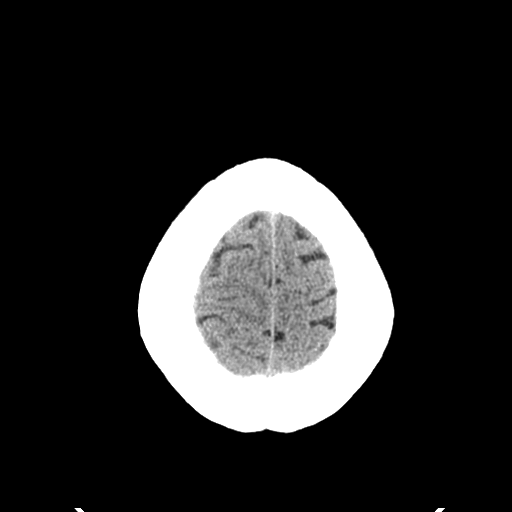
[im 27/30  brain]
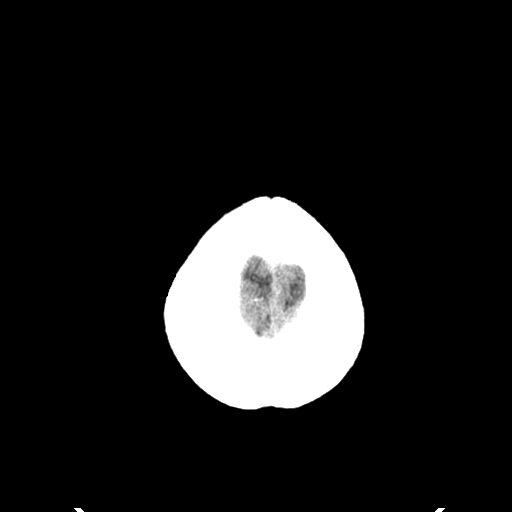

[Series 4: coronal · coronal · 0.29mm/px · 3 of 77 slices shown]
[im 26/77  brain]
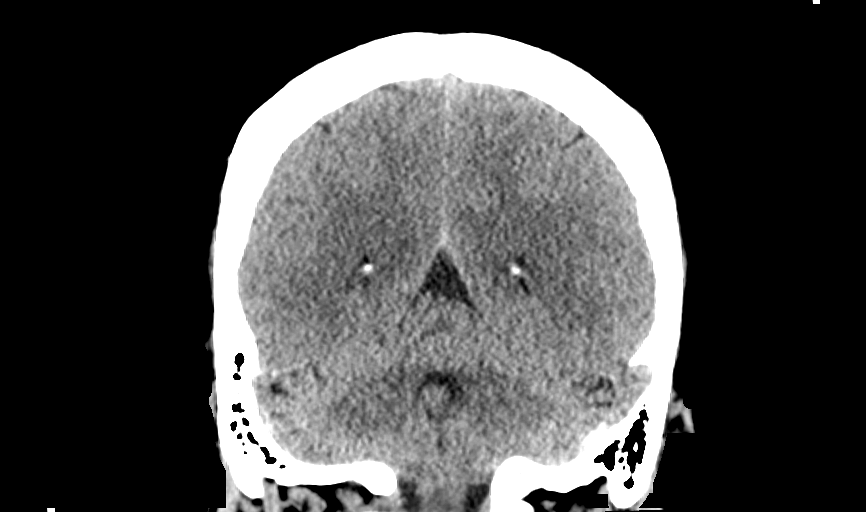
[im 34/77  brain]
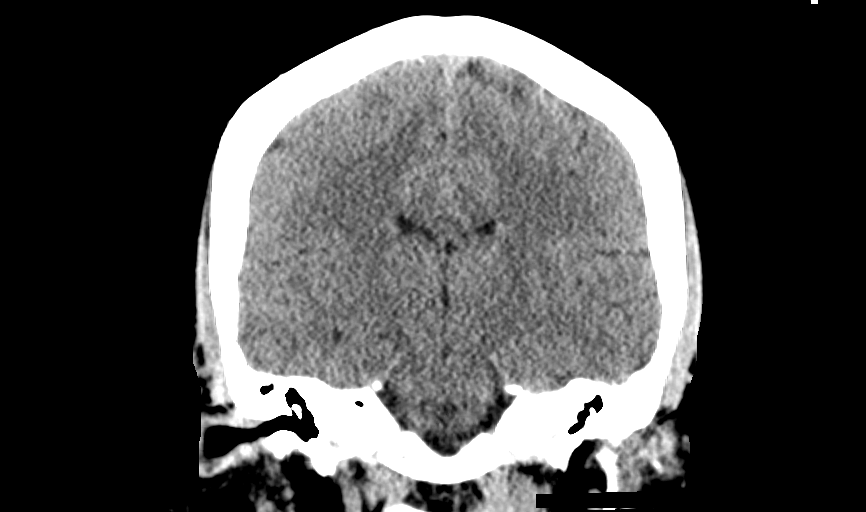
[im 43/77  brain]
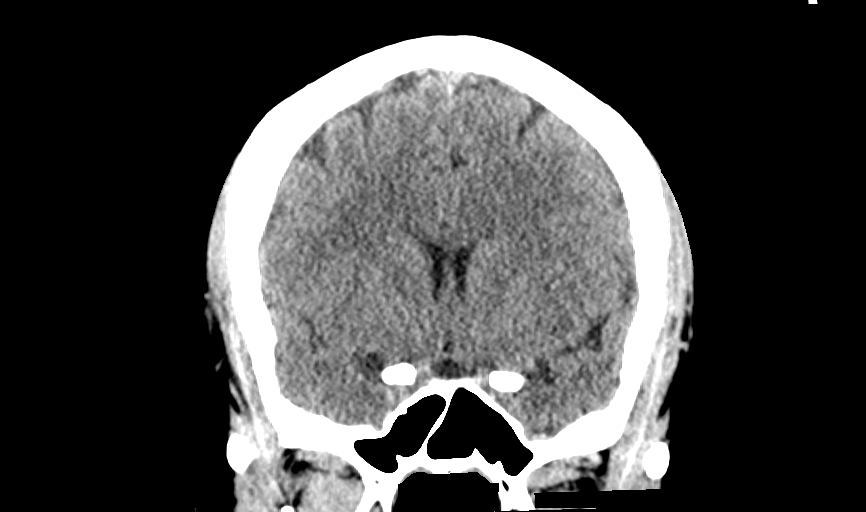

[14 of 47 positions shown; findings below may reference images not displayed]

FINDINGS: Brain: No evidence of acute infarction, hemorrhage, hydrocephalus,
extra-axial collection or mass lesion/mass effect.

The posterior fossa, including the cerebellum, brainstem and fourth
ventricle, is within normal limits. The third and lateral
ventricles, and basal ganglia are unremarkable in appearance. The
cerebral hemispheres are symmetric in appearance, with normal
gray-white differentiation. No mass effect or midline shift is seen.

Vascular: No hyperdense vessel or unexpected calcification.

Skull: There is no evidence of fracture; visualized osseous
structures are unremarkable in appearance.

Sinuses/Orbits: The visualized portions of the orbits are within
normal limits. The paranasal sinuses and mastoid air cells are
well-aerated.

Other: No significant soft tissue abnormalities are seen.
IMPRESSION: Unremarkable noncontrast CT of the head.

## 2019-03-23 ENCOUNTER — Telehealth: Payer: Self-pay | Admitting: Family Medicine

## 2019-03-23 NOTE — Telephone Encounter (Signed)
Patient needs to know where and how to get a corona virus test, so she can get it done before her upcoming surgery which will be out of town.  Please call her back at [phone]

## 2019-03-24 NOTE — Telephone Encounter (Signed)
Pt informed. Tomio Kirk T Cabela Pacifico, CMA  

## 2019-08-08 ENCOUNTER — Other Ambulatory Visit: Payer: Self-pay

## 2019-08-08 ENCOUNTER — Emergency Department (HOSPITAL_COMMUNITY): Payer: Medicaid Other

## 2019-08-08 ENCOUNTER — Emergency Department (HOSPITAL_COMMUNITY)
Admission: EM | Admit: 2019-08-08 | Discharge: 2019-08-08 | Disposition: A | Discharge status: Home / Self Care | Payer: Medicaid Other | Attending: Emergency Medicine | Admitting: Emergency Medicine

## 2019-08-08 DIAGNOSIS — Y999 Unspecified external cause status: Secondary | ICD-10-CM | POA: Diagnosis not present

## 2019-08-08 DIAGNOSIS — Z87891 Personal history of nicotine dependence: Secondary | ICD-10-CM | POA: Insufficient documentation

## 2019-08-08 DIAGNOSIS — Z79899 Other long term (current) drug therapy: Secondary | ICD-10-CM | POA: Diagnosis not present

## 2019-08-08 DIAGNOSIS — S61412A Laceration without foreign body of left hand, initial encounter: Secondary | ICD-10-CM

## 2019-08-08 DIAGNOSIS — W25XXXA Contact with sharp glass, initial encounter: Secondary | ICD-10-CM | POA: Insufficient documentation

## 2019-08-08 DIAGNOSIS — Y93G1 Activity, food preparation and clean up: Secondary | ICD-10-CM | POA: Insufficient documentation

## 2019-08-08 DIAGNOSIS — Z853 Personal history of malignant neoplasm of breast: Secondary | ICD-10-CM | POA: Diagnosis not present

## 2019-08-08 DIAGNOSIS — Y929 Unspecified place or not applicable: Secondary | ICD-10-CM | POA: Diagnosis not present

## 2019-08-08 MED ORDER — LIDOCAINE-EPINEPHRINE-TETRACAINE (LET) TOPICAL GEL
3.0000 mL | Freq: Once | TOPICAL | Status: AC
  Administered 2019-08-08: 20:00:00 3 mL via TOPICAL
  Filled 2019-08-08: qty 3

## 2019-08-08 MED ORDER — CEPHALEXIN 500 MG PO CAPS: 500.0000 mg | ORAL_CAPSULE | Freq: Two times a day (BID) | ORAL | 0 refills | Status: AC

## 2019-08-08 MED ORDER — LIDOCAINE-EPINEPHRINE (PF) 2 %-1:200000 IJ SOLN
10.0000 mL | Freq: Once | INTRAMUSCULAR | Status: AC
  Administered 2019-08-08: 10 mL

## 2019-08-08 MED ORDER — LIDOCAINE-EPINEPHRINE (PF) 2 %-1:200000 IJ SOLN
INTRAMUSCULAR | Status: AC
  Filled 2019-08-08: qty 20

## 2019-08-08 MED ORDER — CEPHALEXIN 500 MG PO CAPS
500.0000 mg | ORAL_CAPSULE | Freq: Once | ORAL | Status: AC
Start: 2019-08-08 — End: 2019-08-08
  Administered 2019-08-08: 21:00:00 500 mg via ORAL
  Filled 2019-08-08: qty 1
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
## 2019-08-08 NOTE — Discharge Instructions (Addendum)
1. Medications: Tylenol or ibuprofen for pain, usual home medications, antibiotics 2. Treatment: ice for swelling, keep wound clean with warm soap and water and keep bandage dry 3. Follow Up: Please return in 10 days to have your stitches removed or sooner if you have concerns. Return to the emergency department for increased redness, drainage of pus from the wound   WOUND CARE  Remove bandage and wash wound gently with mild soap and warm water. Reapply a new bandage after cleaning wound   Continue daily cleansing with soap and water until stitches are removed.  Do not apply any ointments or creams to the wound while stitches are in place, as this may cause delayed healing. Return if you experience any of the following signs of infection: Swelling, redness, pus drainage, streaking, fever >101.0 F  Return if you experience excessive bleeding that does not stop after 15-20 minutes of constant, firm pressure.
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
## 2019-08-19 ENCOUNTER — Other Ambulatory Visit: Payer: Self-pay | Admitting: Dermatology

## 2019-08-19 ENCOUNTER — Other Ambulatory Visit (HOSPITAL_COMMUNITY): Payer: Self-pay | Admitting: Dermatology

## 2019-08-19 DIAGNOSIS — R2231 Localized swelling, mass and lump, right upper limb: Secondary | ICD-10-CM

## 2019-08-28 ENCOUNTER — Ambulatory Visit (HOSPITAL_COMMUNITY): Payer: Medicaid Other

## 2019-09-14 ENCOUNTER — Ambulatory Visit (HOSPITAL_COMMUNITY): Payer: Medicaid Other

## 2019-09-14 ENCOUNTER — Encounter (HOSPITAL_COMMUNITY): Payer: Self-pay

## 2021-03-19 ENCOUNTER — Emergency Department (HOSPITAL_COMMUNITY)
Admission: EM | Admit: 2021-03-19 | Discharge: 2021-03-19 | Disposition: A | Discharge status: Home / Self Care | Payer: Medicaid Other | Attending: Emergency Medicine | Admitting: Emergency Medicine

## 2021-03-19 ENCOUNTER — Encounter (HOSPITAL_COMMUNITY): Payer: Self-pay | Admitting: *Deleted

## 2021-03-19 ENCOUNTER — Other Ambulatory Visit: Payer: Self-pay

## 2021-03-19 DIAGNOSIS — X118XXA Contact with other hot tap-water, initial encounter: Secondary | ICD-10-CM | POA: Insufficient documentation

## 2021-03-19 DIAGNOSIS — T2122XA Burn of second degree of abdominal wall, initial encounter: Secondary | ICD-10-CM | POA: Diagnosis not present

## 2021-03-19 DIAGNOSIS — Z23 Encounter for immunization: Secondary | ICD-10-CM | POA: Diagnosis not present

## 2021-03-19 DIAGNOSIS — Z853 Personal history of malignant neoplasm of breast: Secondary | ICD-10-CM | POA: Diagnosis not present

## 2021-03-19 DIAGNOSIS — Z87891 Personal history of nicotine dependence: Secondary | ICD-10-CM | POA: Diagnosis not present

## 2021-03-19 DIAGNOSIS — T2102XA Burn of unspecified degree of abdominal wall, initial encounter: Secondary | ICD-10-CM | POA: Diagnosis present

## 2021-03-19 HISTORY — DX: Malignant (primary) neoplasm, unspecified: C80.1

## 2021-03-19 MED ORDER — MELOXICAM 7.5 MG PO TABS: 7.5000 mg | ORAL_TABLET | Freq: Two times a day (BID) | ORAL | 0 refills | Status: AC | PRN

## 2021-03-19 MED ORDER — TETANUS-DIPHTH-ACELL PERTUSSIS 5-2.5-18.5 LF-MCG/0.5 IM SUSY
0.5000 mL | PREFILLED_SYRINGE | Freq: Once | INTRAMUSCULAR | Status: AC
  Administered 2021-03-19: 0.5 mL via INTRAMUSCULAR
  Filled 2021-03-19: qty 0.5

## 2021-03-19 MED ORDER — BACITRACIN ZINC 500 UNIT/GM EX OINT
1.0000 "application " | TOPICAL_OINTMENT | Freq: Two times a day (BID) | CUTANEOUS | Status: DC
  Administered 2021-03-19: 1 via TOPICAL
  Filled 2021-03-19: qty 4.5

## 2021-03-19 MED ORDER — BACITRACIN ZINC 500 UNIT/GM EX OINT: 1.0000 "application " | TOPICAL_OINTMENT | Freq: Two times a day (BID) | CUTANEOUS | 0 refills | Status: AC

## 2021-03-19 MED ORDER — KETOROLAC TROMETHAMINE 60 MG/2ML IM SOLN
60.0000 mg | Freq: Once | INTRAMUSCULAR | Status: AC
  Administered 2021-03-19: 60 mg via INTRAMUSCULAR
  Filled 2021-03-19: qty 2

## 2021-03-19 NOTE — ED Notes (Signed)
Per Dr Sabra Heck at bedside No IV or blood work at this time

## 2021-03-19 NOTE — ED Notes (Signed)
Discharge instructions including follow up and wound management discussed with pt. Pt verbalized understanding with no questions at this time. Pt to go home with daughter at bedside.
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
## 2021-03-23 ENCOUNTER — Other Ambulatory Visit: Payer: Self-pay

## 2021-03-23 ENCOUNTER — Encounter (HOSPITAL_COMMUNITY): Payer: Self-pay | Admitting: Emergency Medicine

## 2021-03-23 ENCOUNTER — Emergency Department (HOSPITAL_COMMUNITY)
Admission: EM | Admit: 2021-03-23 | Discharge: 2021-03-24 | Disposition: A | Discharge status: Home / Self Care | Payer: Medicaid Other | Attending: Emergency Medicine | Admitting: Emergency Medicine

## 2021-03-23 DIAGNOSIS — X19XXXA Contact with other heat and hot substances, initial encounter: Secondary | ICD-10-CM | POA: Diagnosis not present

## 2021-03-23 DIAGNOSIS — Z5321 Procedure and treatment not carried out due to patient leaving prior to being seen by health care provider: Secondary | ICD-10-CM | POA: Insufficient documentation

## 2021-03-23 DIAGNOSIS — T24132A Burn of first degree of left lower leg, initial encounter: Secondary | ICD-10-CM | POA: Diagnosis not present

## 2021-03-23 DIAGNOSIS — R509 Fever, unspecified: Secondary | ICD-10-CM | POA: Insufficient documentation

## 2021-03-23 DIAGNOSIS — T24131A Burn of first degree of right lower leg, initial encounter: Secondary | ICD-10-CM | POA: Diagnosis not present

## 2021-03-23 DIAGNOSIS — T24031A Burn of unspecified degree of right lower leg, initial encounter: Secondary | ICD-10-CM | POA: Diagnosis present

## 2021-03-23 DIAGNOSIS — T22112A Burn of first degree of left forearm, initial encounter: Secondary | ICD-10-CM | POA: Diagnosis not present

## 2021-03-23 LAB — CBC WITH DIFFERENTIAL/PLATELET
Abs Immature Granulocytes: 0.03 10*3/uL (ref 0.00–0.07)
Basophils Absolute: 0 10*3/uL (ref 0.0–0.1)
Basophils Relative: 0 %
Eosinophils Absolute: 0.1 10*3/uL (ref 0.0–0.5)
Eosinophils Relative: 2 %
HCT: 37.9 % (ref 36.0–46.0)
Hemoglobin: 12.6 g/dL (ref 12.0–15.0)
Immature Granulocytes: 0 %
Lymphocytes Relative: 22 %
Lymphs Abs: 1.9 10*3/uL (ref 0.7–4.0)
MCH: 33.1 pg (ref 26.0–34.0)
MCHC: 33.2 g/dL (ref 30.0–36.0)
MCV: 99.5 fL (ref 80.0–100.0)
Monocytes Absolute: 0.7 10*3/uL (ref 0.1–1.0)
Monocytes Relative: 8 %
Neutro Abs: 5.9 10*3/uL (ref 1.7–7.7)
Neutrophils Relative %: 68 %
Platelets: 273 10*3/uL (ref 150–400)
RBC: 3.81 MIL/uL — ABNORMAL LOW (ref 3.87–5.11)
RDW: 12.3 % (ref 11.5–15.5)
WBC: 8.6 10*3/uL (ref 4.0–10.5)
nRBC: 0 % (ref 0.0–0.2)

## 2021-03-23 LAB — BASIC METABOLIC PANEL
Anion gap: 9 (ref 5–15)
BUN: 13 mg/dL (ref 6–20)
CO2: 23 mmol/L (ref 22–32)
Calcium: 8.8 mg/dL — ABNORMAL LOW (ref 8.9–10.3)
Chloride: 106 mmol/L (ref 98–111)
Creatinine, Ser: 0.9 mg/dL (ref 0.44–1.00)
GFR, Estimated: >60 mL/min (ref >60)
Glucose, Bld: 109 mg/dL — ABNORMAL HIGH (ref 70–99)
Potassium: 3.9 mmol/L (ref 3.5–5.1)
Sodium: 138 mmol/L (ref 135–145)
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
## 2021-03-27 ENCOUNTER — Ambulatory Visit (HOSPITAL_COMMUNITY)
Admission: EM | Admit: 2021-03-27 | Discharge: 2021-03-27 | Disposition: A | Discharge status: Home / Self Care | Payer: Medicaid Other | Attending: Emergency Medicine | Admitting: Emergency Medicine

## 2021-03-27 ENCOUNTER — Encounter (HOSPITAL_COMMUNITY): Payer: Self-pay

## 2021-03-27 ENCOUNTER — Other Ambulatory Visit: Payer: Self-pay

## 2021-03-27 DIAGNOSIS — T2122XD Burn of second degree of abdominal wall, subsequent encounter: Secondary | ICD-10-CM

## 2021-03-27 DIAGNOSIS — T24211D Burn of second degree of right thigh, subsequent encounter: Secondary | ICD-10-CM | POA: Diagnosis not present

## 2021-03-27 DIAGNOSIS — T22212D Burn of second degree of left forearm, subsequent encounter: Secondary | ICD-10-CM | POA: Diagnosis not present

## 2021-03-27 DIAGNOSIS — T24219D Burn of second degree of unspecified thigh, subsequent encounter: Secondary | ICD-10-CM

## 2021-03-27 DIAGNOSIS — T24212D Burn of second degree of left thigh, subsequent encounter: Secondary | ICD-10-CM | POA: Diagnosis not present

## 2021-03-27 MED ORDER — "XEROFORM PETROLATUM DRES 5""X9"" 3 % EX PADS": 20.0000 | MEDICATED_PAD | Freq: Every day | CUTANEOUS | 0 refills | Status: AC

## 2021-03-27 MED ORDER — DIPHENHYDRAMINE HCL 50 MG PO CAPS: 50.0000 mg | ORAL_CAPSULE | Freq: Three times a day (TID) | ORAL | 0 refills | Status: DC | PRN

## 2021-03-27 MED ORDER — BACITRACIN 500 UNIT/GM EX OINT: 1.0000 "application " | TOPICAL_OINTMENT | Freq: Every day | CUTANEOUS | 1 refills | Status: AC

## 2021-03-27 MED ORDER — BACITRACIN ZINC 500 UNIT/GM EX OINT
TOPICAL_OINTMENT | CUTANEOUS | Status: AC
  Filled 2021-03-27: qty 28.35

## 2021-03-27 MED ORDER — VASELINE PETROLATUM GAUZE EX PADS: 20.0000 | MEDICATED_PAD | Freq: Every day | CUTANEOUS | 0 refills | Status: AC
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
## 2021-03-27 NOTE — ED Triage Notes (Signed)
Pt c/o fluid in the left hand and states she has a ringworm on the burn.

## 2021-04-14 ENCOUNTER — Institutional Professional Consult (permissible substitution): Payer: Medicaid Other | Admitting: Plastic Surgery

## 2021-04-28 ENCOUNTER — Institutional Professional Consult (permissible substitution): Payer: Medicaid Other | Admitting: Plastic Surgery

## 2021-12-13 NOTE — Progress Notes (Deleted)
Subjective:  Patient ID: Amber Villarreal, female    DOB: 1976-06-09, 46 y.o.   MRN: [id number]  CC: New Patient  HPI:  Amber Villarreal is a very pleasant 46 y.o. female who presents today to establish care.  History of DICS, has been on Tamoxifen intermittently for treatment.   Healthcare maintenance: Needs colonoscopy and pap    PMHx: Past Medical History:  Diagnosis Date   Anemia    history of   Bipolar disorder, unspecified ([place])    Cancer ([place])    breast   Family history of prostate cancer    Hx of cardiovascular stress test    a. Lex MV 2/14: EF 50%, no ischemia, small L breast nodule   Opioid type dependence, unspecified     Surgical Hx: Past Surgical History:  Procedure Laterality Date   DILATION AND CURETTAGE OF UTERUS      Family Hx: Family History  Problem Relation Age of Onset   Heart disease Mother 68       bypass   Diabetes Mother    Hypertension Mother    Cancer Mother        vaginal, vs cervical, vs ovarian dx in her 20s   Congestive Heart Failure Mother        early 77's   Stroke Mother    Coronary artery disease Mother    Hypertension Sister    Coronary artery disease Unknown    Coronary artery disease Maternal Aunt    Colon cancer Maternal Uncle    Prostate cancer Maternal Uncle     Social Hx: Current Social History   (Please include date ( .td) when updating information )  Who lives at home: *** 12/13/2021  Who would speak for you about health care matters: *** 12/13/2021  Transportation: *** 12/13/2021 Important Relationships & Pets: *** 12/13/2021  Current Stressors: *** 12/13/2021 Work / Education:  *** 12/13/2021 Religious / Personal Beliefs: *** 12/13/2021 Interests / Fun: *** 12/13/2021 Other/Social: *** 12/13/2021   Medications:   ROS: Woman:  Patient reports no  vision/ hearing changes,anorexia, weight change, fever ,adenopathy, persistant / recurrent hoarseness, swallowing issues, chest pain, edema,persistant / recurrent cough,  hemoptysis, dyspnea(rest, exertional, paroxysmal nocturnal), gastrointestinal  bleeding (melena, rectal bleeding), abdominal pain, excessive heart burn, GU symptoms(dysuria, hematuria, pyuria, voiding/incontinence  Issues) syncope, focal weakness, severe memory loss, concerning skin lesions, depression, anxiety, abnormal bruising/bleeding, major joint swelling, breast masses or abnormal vaginal bleeding.    Man:  Patient reports no  vision/ hearing changes,anorexia, weight change, fever ,adenopathy, persistant / recurrent hoarseness, swallowing issues, chest pain, edema,persistant / recurrent cough, hemoptysis, dyspnea(rest, exertional, paroxysmal nocturnal), gastrointestinal  bleeding (melena, rectal bleeding), abdominal pain, excessive heart burn, GU symptoms(dysuria, hematuria, pyuria, voiding/incontinence  Issues) syncope, focal weakness, severe memory loss, concerning skin lesions, depression, anxiety, abnormal bruising/bleeding, major joint swelling.    Preventative Screening Colonoscopy: year*** results *** Mammogram: year*** results *** Pap test: year*** results *** PSA: year*** results *** DEXA: year*** results *** Tetanus vaccine: year*** results *** Pneumonia vaccine: year*** results *** Shingles vaccine: year*** results *** Heart stress test: year*** results *** Echocardiogram: year*** results *** Xrays: year*** results *** CT/MRI: year*** results ***  Smoking status reviewed  ROS: pertinent noted in the HPI    Objective:  There were no vitals taken for this visit. Vitals and nursing note reviewed  General: NAD, pleasant, able to participate in exam HEENT: normocephalic, TM's visualized bilaterally, no scleral icterus or conjunctival pallor, no nasal discharge, moist mucous membranes, good  dentition without erythema or discharge noted in posterior oropharynx Neck: supple, non-tender, without lymphadenopathy Cardiac: RRR, S1 S2 present. normal heart sounds, no  murmurs. Respiratory: CTAB, normal effort, No wheezes, rales or rhonchi Abdomen: Normoactive bowel sounds, non-tender, non-distended, no hepatosplenomegaly Extremities: no edema or cyanosis. Skin: warm and dry, no rashes noted Neuro: alert, no obvious focal deficits Psych: Normal affect and mood  Assessment & Plan:  No problem-specific Assessment & Plan notes found for this encounter.   No orders of the defined types were placed in this encounter.  No orders of the defined types were placed in this encounter.  No follow-ups on file. '@SIGNNOTE'$ @

## 2021-12-15 ENCOUNTER — Ambulatory Visit: Payer: Medicaid Other | Admitting: Student
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
## 2024-03-25 ENCOUNTER — Encounter (HOSPITAL_COMMUNITY): Payer: Self-pay

## 2024-03-25 ENCOUNTER — Ambulatory Visit (HOSPITAL_COMMUNITY)
Admission: EM | Admit: 2024-03-25 | Discharge: 2024-03-25 | Disposition: A | Discharge status: Home / Self Care | Attending: Family Medicine | Admitting: Family Medicine

## 2024-03-25 DIAGNOSIS — R102 Pelvic and perineal pain unspecified side: Secondary | ICD-10-CM | POA: Diagnosis not present

## 2024-03-25 DIAGNOSIS — N898 Other specified noninflammatory disorders of vagina: Secondary | ICD-10-CM | POA: Insufficient documentation

## 2024-03-25 DIAGNOSIS — Z3202 Encounter for pregnancy test, result negative: Secondary | ICD-10-CM

## 2024-03-25 DIAGNOSIS — R1030 Lower abdominal pain, unspecified: Secondary | ICD-10-CM | POA: Insufficient documentation

## 2024-03-25 DIAGNOSIS — N6459 Other signs and symptoms in breast: Secondary | ICD-10-CM | POA: Insufficient documentation

## 2024-03-25 DIAGNOSIS — Z853 Personal history of malignant neoplasm of breast: Secondary | ICD-10-CM | POA: Insufficient documentation

## 2024-03-25 LAB — POCT URINALYSIS DIP (MANUAL ENTRY)
Bilirubin, UA: NEGATIVE
Blood, UA: NEGATIVE
Glucose, UA: NEGATIVE mg/dL
Ketones, POC UA: NEGATIVE mg/dL
Leukocytes, UA: NEGATIVE
Nitrite, UA: NEGATIVE
Protein Ur, POC: NEGATIVE mg/dL
Spec Grav, UA: 1.025 (ref 1.010–1.025)
Urobilinogen, UA: 0.2 U/dL
pH, UA: 5.5 (ref 5.0–8.0)

## 2024-03-25 LAB — HIV ANTIBODY (ROUTINE TESTING W REFLEX): HIV Screen 4th Generation wRfx: NONREACTIVE

## 2024-03-25 LAB — HEPATITIS C ANTIBODY: HCV Ab: NONREACTIVE

## 2024-03-25 LAB — POCT URINE PREGNANCY: Preg Test, Ur: NEGATIVE

## 2024-03-25 MED ORDER — METRONIDAZOLE 500 MG PO TABS: 500.0000 mg | ORAL_TABLET | Freq: Two times a day (BID) | ORAL | 0 refills | Status: DC

## 2024-03-25 MED ORDER — LIDOCAINE HCL (PF) 1 % IJ SOLN
INTRAMUSCULAR | Status: AC
  Filled 2024-03-25: qty 2

## 2024-03-25 MED ORDER — CEFTRIAXONE SODIUM 500 MG IJ SOLR
500.0000 mg | INTRAMUSCULAR | Status: DC
  Administered 2024-03-25: 500 mg via INTRAMUSCULAR

## 2024-03-25 MED ORDER — CEFTRIAXONE SODIUM 500 MG IJ SOLR
INTRAMUSCULAR | Status: AC
  Filled 2024-03-25: qty 500

## 2024-03-25 NOTE — ED Provider Notes (Signed)
 [facility]    CSN: [id number] Arrival date & time: 03/25/24  1646      History   Chief Complaint Chief Complaint  Patient presents with   Female GU Problem    HPI Amber Villarreal is a 48 y.o. female.   Patient presents today for several concerns.  Her primary concern today is a several month history of abnormal mass in her left breast.  She has a history of breast cancer and underwent bilateral mastectomy with reconstructive surgery several years ago.  She has had adhesive capsulitis of the implant and so has been using topical over-the-counter medication to help break up scar tissue.  She has noticed that this improved but then has developed a mass that has spread towards her axilla and her left breast.  She does report some soreness and irritation around this area but is not experiencing significant pain.  Denies any fever, nausea, vomiting, weight loss.  She was followed by [facility] oncology but has not seen them recently due to transportation issues as she lives in [place].  In addition, she reports a several month history of intermittent pelvic pain and vaginal discharge.  She reports that vaginal discharge is copious and malodorous and tends to be worse near her menstrual cycle.  She has not been sexually active in several years but is interested in complete STI panel.  She denies any fever, nausea, vomiting.  She has not been trying any over-the-counter medications for symptom management.    Past Medical History:  Diagnosis Date   Anemia    history of   Bipolar disorder, unspecified (HCC)    Cancer (HCC)    breast   Family history of prostate cancer    Hx of cardiovascular stress test    a. 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    Chalazion of right upper eyelid 05/07/2017   Generalized muscle ache 05/07/2017   Raynaud's syndrome 05/07/2017   Contraceptive management 04/05/2016   Family history of malignant neoplasm of gastrointestinal tract 10/07/2015   Melanocytic nevi of right upper limb, including shoulder 10/07/2015   Alcohol abuse 09/04/2013   High risk sexual behavior 09/19/2012   Breast nodule 08/04/2012   Obesity 06/01/2011   BIPOLAR DISORDER UNSPECIFIED 06/10/2008    Past Surgical History:  Procedure Laterality Date   DILATION AND CURETTAGE OF UTERUS      OB History     Gravida  6   Para  4   Term  4   Preterm  0   AB  2   Living  4      SAB  1   IAB  1   Ectopic      Multiple      Live Births               Home Medications    Prior to Admission medications   Medication Sig Start Date End Date Taking? Authorizing Provider  metroNIDAZOLE  (FLAGYL ) 500 MG tablet Take 1 tablet (500 mg total) by mouth 2 (two) times daily. 03/25/24  Yes Danetta Prom, Rocky POUR, PA-C  albuterol  (PROVENTIL  HFA;VENTOLIN  HFA) 108 (90 Base) MCG/ACT inhaler Inhale 2 puffs into the lungs every 6 (six) hours as needed for  wheezing or shortness of breath. Patient not taking: Reported on 10/16/2017 09/11/16   McKeag, Chauncey BIRCH, MD  aspirin-acetaminophen -caffeine (EXCEDRIN MIGRAINE) 250-250-65 MG tablet Take 1 tablet by mouth every 6 (six) hours as needed for headache.    [provider]  bacitracin  ointment Apply 1 application topically 2 (two) times daily. 03/19/21   Cleotilde Rogue, MD  diphenhydrAMINE  (BENADRYL ) 50 MG capsule Take 1 capsule (50 mg total) by mouth every 8 (eight) hours as needed for itching or sleep. 03/27/21 05/08/21  Joesph Shaver Scales, PA-C  ibuprofen  (ADVIL ,MOTRIN ) 600 MG tablet Take 1 tablet (600 mg total) by mouth every 6 (six) hours as needed. Patient not taking: Reported on 10/16/2017 07/31/17   Mortenson, Ashley, MD  Multiple Vitamin (MULTIVITAMIN WITH MINERALS) TABS tablet Take 1  tablet by mouth daily.    [provider]  pantoprazole  (PROTONIX ) 40 MG tablet Take 1 tablet (40 mg total) by mouth daily. Patient not taking: Reported on 10/16/2017 09/19/16   McKeag, Chauncey BIRCH, MD    Family History Family History  Problem Relation Age of Onset   Heart disease Mother 41       bypass   Diabetes Mother    Hypertension Mother    Cancer Mother        vaginal, vs cervical, vs ovarian dx in her 52s   Congestive Heart Failure Mother        early 20's   Stroke Mother    Coronary artery disease Mother    Hypertension Sister    Coronary artery disease Unknown    Coronary artery disease Maternal Aunt    Colon cancer Maternal Uncle    Prostate cancer Maternal Uncle     Social History Social History   Tobacco Use   Smoking status: Former    Current packs/day: 0.00    Average packs/day: 0.5 packs/day for 15.0 years (7.5 ttl pk-yrs)    Types: Cigarettes    Start date: 06/25/1993    Quit date: 06/25/2008    Years since quitting: 15.7   Smokeless tobacco: Never  Substance Use Topics   Alcohol use: Yes    Alcohol/week: 7.0 standard drinks of alcohol    Types: 2 Glasses of wine, 3 Cans of beer, 2 Shots of liquor per week    Comment: Occasional   Drug use: No    Comment: history of marijuana use     Allergies   Morphine and codeine, Prednisone , and Shellfish allergy   Review of Systems Review of Systems  Constitutional:  Positive for activity change. Negative for appetite change, fatigue and fever.  Gastrointestinal:  Negative for nausea and vomiting.  Genitourinary:  Positive for pelvic pain and vaginal discharge. Negative for dysuria, frequency, urgency, vaginal bleeding and vaginal pain.     Physical Exam Triage Vital Signs ED Triage Vitals  Encounter Vitals Group     BP 03/25/24 1817 (!) 166/100     Girls Systolic BP Percentile --      Girls Diastolic BP Percentile --      Boys Systolic BP Percentile --      Boys Diastolic BP Percentile --       Pulse Rate 03/25/24 1817 81     Resp 03/25/24 1817 16     Temp 03/25/24 1817 98.2 F (36.8 C)     Temp Source 03/25/24 1817 Oral     SpO2 03/25/24 1817 96 %     Weight --      Height --  Head Circumference --      Peak Flow --      Pain Score 03/25/24 1834 0     Pain Loc --      Pain Education --      Exclude from Growth Chart --    No data found.  Updated Vital Signs BP (!) 166/100 (BP Location: Right Arm)   Pulse 81   Temp 98.2 F (36.8 C) (Oral)   Resp 16   LMP 03/04/2024 (Approximate)   SpO2 96%   Visual Acuity Right Eye Distance:   Left Eye Distance:   Bilateral Distance:    Right Eye Near:   Left Eye Near:    Bilateral Near:     Physical Exam Vitals reviewed. Exam conducted with a chaperone present.  Constitutional:      General: She is awake. She is not in acute distress.    Appearance: Normal appearance. She is well-developed. She is not ill-appearing.     Comments: Very pleasant female appears stated age in no acute distress sitting comfortably in exam room  HENT:     Head: Normocephalic and atraumatic.  Cardiovascular:     Rate and Rhythm: Normal rate and regular rhythm.     Heart sounds: Normal heart sounds, S1 normal and S2 normal. No murmur heard. Pulmonary:     Effort: Pulmonary effort is normal.     Breath sounds: Normal breath sounds. No wheezing, rhonchi or rales.     Comments: Clear to auscultation bilaterally Chest:     Chest wall: Deformity present. No swelling or tenderness.     Comments: [place], RN present as chaperone during exam.  Firm mobile well-defined mass measuring approximately 2 cm x 3 cm noted left lateral breast.  Induration and scarring noted bilateral inferior breasts with underlying implants. Abdominal:     Palpations: Abdomen is soft.     Tenderness: There is no abdominal tenderness. There is no right CVA tenderness, left CVA tenderness, guarding or rebound.  Genitourinary:    Vagina: Vaginal discharge present.      Cervix: Normal.     Uterus: Normal.      Adnexa: Right adnexa normal and left adnexa normal.       Right: No tenderness.         Left: No tenderness.       Comments: Evidence of excoriation noted labia majora and in perineum.  No rash or lesions noted.  Mild discomfort on bimanual exam without significant CMT or adnexal tenderness.  Discharge noted posterior vaginal vault and surgical os. Psychiatric:        Behavior: Behavior is cooperative.      UC Treatments / Results  Labs (all labs ordered are listed, but only abnormal results are displayed) Labs Reviewed  POCT URINALYSIS DIP (MANUAL ENTRY) - Abnormal; Notable for the following components:      Result Value   Color, UA straw (*)    Clarity, UA hazy (*)    All other components within normal limits  POCT URINE PREGNANCY - Normal  RPR  HIV ANTIBODY (ROUTINE TESTING W REFLEX)  HEPATITIS C ANTIBODY  CERVICOVAGINAL ANCILLARY ONLY    EKG   Radiology No results found.  Procedures Procedures (including critical care time)  Medications Ordered in UC Medications  cefTRIAXone  (ROCEPHIN ) injection 500 mg (500 mg Intramuscular Given 03/25/24 1952)    Initial Impression / Assessment and Plan / UC Course  I have reviewed the triage vital signs and the nursing notes.  Pertinent labs & imaging results that were available during my care of the patient were reviewed by me and considered in my medical decision making (see chart for details).     Patient is well-appearing, afebrile, nontoxic, nontachycardic.  She does have a mobile mass in her left breast and so I recommend that she obtain imaging to investigate this further.  Low suspicion for abscess as lesion does not feel indurated and is not significantly tender.  Outpatient diagnostic mammogram for implants and ultrasound of left breast were ordered we discussed that if she has any difficulty scheduling this within the next week or so she is to contact us  so we can help arrange  follow-up.  If she develops any additional or changing symptoms she needs to be seen immediately.  Urine pregnancy was negative.  UA did not show any significant evidence of infection.  Vital signs and physical exam are reassuring with no indication for emergent evaluation or imaging.  Low suspicion for PID as she had no significant tenderness on bimanual exam.  I am more concerned for BV and so was started on metronidazole  and we discussed that she is avoid alcohol while on this medication due to associated Antabuse side effects.  Cervical vaginal swab was obtained during pelvic exam per patient request.  Additional testing for oral STIs including hepatitis C was also obtained during visit per patient request.  We will contact her if any of this is abnormal and she has her treatment plan.  Given her ongoing pelvic pain and abnormal discharge she requested empiric treatment for gonorrhea as transportation is difficult.  She was given 500 mg of Rocephin  during her clinic visit today.  We discussed that if she has any worsening or changing symptoms she needs to be seen immediately.  Recommend close follow-up with OB/GYN was given the contact information for local provider.  Strict return precautions given.  All questions answered to patient satisfaction.  Final Clinical Impressions(s) / UC Diagnoses   Final diagnoses:  Lower abdominal pain  Pelvic pain  Vaginal discharge  Abnormal breast finding  History of breast cancer     Discharge Instructions      We gave an injection today.  Take metronidazole  twice daily for 7 days.  Do not drink any alcohol while on this medication as it would cause you to vomit.  Wear loosefitting cotton underwear and use hypoallergenic soaps and detergents.  Please follow-up with OB/GYN; call to schedule an appointment.  We will contact you if need to arrange any additional treatment.  If you develop any severe abdominal pain, pelvic pain, fever, nausea, vomiting you need  to be seen immediately.  I have placed a referral for mammogram and ultrasound.  If you do not hear from someone to schedule this within the week please return here or let us  know.  If anything worsens or changes please be seen immediately.     ED Prescriptions     Medication Sig Dispense Auth. Provider   metroNIDAZOLE  (FLAGYL ) 500 MG tablet Take 1 tablet (500 mg total) by mouth 2 (two) times daily. 14 tablet Mihcael Ledee K, PA-C      PDMP not reviewed this encounter.   Sherrell Rocky POUR, PA-C 03/25/24 2037

## 2024-03-25 NOTE — ED Triage Notes (Signed)
 Pt states she had a double mastectomy in 2019 and now she feels like something is coming out of her left breast.  Pt states she is also having pelvic pain and vaginal odor.

## 2024-03-25 NOTE — Discharge Instructions (Signed)
 We gave an injection today.  Take metronidazole  twice daily for 7 days.  Do not drink any alcohol while on this medication as it would cause you to vomit.  Wear loosefitting cotton underwear and use hypoallergenic soaps and detergents.  Please follow-up with OB/GYN; call to schedule an appointment.  We will contact you if need to arrange any additional treatment.  If you develop any severe abdominal pain, pelvic pain, fever, nausea, vomiting you need to be seen immediately.  I have placed a referral for mammogram and ultrasound.  If you do not hear from someone to schedule this within the week please return here or let us  know.  If anything worsens or changes please be seen immediately.

## 2024-03-26 ENCOUNTER — Encounter (HOSPITAL_COMMUNITY): Payer: Self-pay | Admitting: Physician Assistant

## 2024-03-26 ENCOUNTER — Ambulatory Visit (HOSPITAL_COMMUNITY): Payer: Self-pay

## 2024-03-26 LAB — CERVICOVAGINAL ANCILLARY ONLY
Bacterial Vaginitis (gardnerella): POSITIVE — AB
Candida Glabrata: NEGATIVE
Candida Vaginitis: NEGATIVE
Chlamydia: NEGATIVE
Comment: NEGATIVE
Comment: NEGATIVE
Comment: NEGATIVE
Comment: NEGATIVE
Comment: NEGATIVE
Comment: NORMAL
Neisseria Gonorrhea: NEGATIVE
Trichomonas: NEGATIVE

## 2024-03-26 LAB — RPR: RPR Ser Ql: NONREACTIVE

## 2024-04-01 ENCOUNTER — Ambulatory Visit
Admission: RE | Admit: 2024-04-01 | Discharge: 2024-04-01 | Disposition: A | Source: Ambulatory Visit | Attending: Physician Assistant | Admitting: Physician Assistant

## 2024-04-01 ENCOUNTER — Other Ambulatory Visit (HOSPITAL_COMMUNITY): Payer: Self-pay | Admitting: Physician Assistant

## 2024-04-01 DIAGNOSIS — N6320 Unspecified lump in the left breast, unspecified quadrant: Secondary | ICD-10-CM

## 2024-04-10 ENCOUNTER — Ambulatory Visit
Admission: RE | Admit: 2024-04-10 | Discharge: 2024-04-10 | Disposition: A | Discharge status: Home / Self Care | Source: Ambulatory Visit | Attending: Physician Assistant | Admitting: Physician Assistant

## 2024-04-10 DIAGNOSIS — N6320 Unspecified lump in the left breast, unspecified quadrant: Secondary | ICD-10-CM

## 2024-04-10 HISTORY — PX: BREAST BIOPSY: SHX20

## 2024-04-13 ENCOUNTER — Ambulatory Visit (HOSPITAL_COMMUNITY): Payer: Self-pay

## 2024-04-13 LAB — SURGICAL PATHOLOGY

## 2024-04-15 ENCOUNTER — Encounter: Payer: Self-pay | Admitting: *Deleted

## 2024-04-15 NOTE — Progress Notes (Signed)
 Sent an email to [place] the transportation rep, he is currently on PAL, I left the message stating patient needed help with transportation

## 2024-04-20 ENCOUNTER — Encounter: Payer: Self-pay | Admitting: *Deleted

## 2024-04-20 DIAGNOSIS — C50412 Malignant neoplasm of upper-outer quadrant of left female breast: Secondary | ICD-10-CM | POA: Insufficient documentation

## 2024-04-20 NOTE — Progress Notes (Signed)
 Radiation Oncology         [phone] ________________________________  Name: Amber Villarreal        MRN: [id number]  Date of Service: 04/22/2024 DOB: 1975/09/13  RR:[id number], No Pcp Per  Curvin Deward MOULD, MD     REFERRING PHYSICIAN: Curvin Deward III, MD   DIAGNOSIS: The primary encounter diagnosis was Ductal carcinoma in situ (DCIS) of left breast. A diagnosis of Malignant neoplasm of upper-outer quadrant of left breast in female, estrogen receptor positive (HCC) was also pertinent to this visit.   HISTORY OF PRESENT ILLNESS: Amber Villarreal is a 48 y.o. female seen in the multidisciplinary breast clinic for a new diagnosis of left breast cancer. The patient was noted to have a history of left breast DCIS treated in 2019 at [facility] with bilateral mastectomies and implant reconstruction.  The patient noticed an area of the 8 o'clock position that has been palpable since her reconstruction surgery and an area in the 4:00 breast that was new.  By ultrasound on 04/01/2024 showed a mass in the 3:00 position measuring 2.2 cm with internal flow and hyperechoic rim. In the 8:00 position there was a 2.6 cm. No comments were made on the axilla. A biopsy on 04/10/24 of the 3:00 position showed a grade 2 invasive ductal carcinoma. The 8:00 position biopsy showed dense fibroitic stromal fibrosis. No carcinoma was noted. The invasive cancer was ER/PR positive, HER2 negative, with a Ki 67 was 3%. She's seen to discuss treatment recommendations for her cancer.    PREVIOUS RADIATION THERAPY: {EXAM; YES/NO:19492::No}   PAST MEDICAL HISTORY:  Past Medical History:  Diagnosis Date   Anemia    history of   Bipolar disorder, unspecified (HCC)    Cancer (HCC)    breast   Family history of prostate cancer    Hx of cardiovascular stress test    a. Lex MV 2/14: EF 50%, no ischemia, small L breast nodule   Opioid type dependence, unspecified        PAST SURGICAL HISTORY: Past Surgical History:  Procedure  Laterality Date   BREAST BIOPSY Left 04/10/2024   US  LT BREAST BX W LOC DEV 1ST LESION IMG BX SPEC US  GUIDE 04/10/2024 [facility]   BREAST BIOPSY Left 04/10/2024   US  LT BREAST BX W LOC DEV EA ADD LESION IMG BX SPEC US  GUIDE 04/10/2024 [facility] MAMMOGRAP[facility]   DILATION AND CURETTAGE OF UTERUS       FAMILY HISTORY:  Family History  Problem Relation Age of Onset   Heart disease Mother 32       bypass   Diabetes Mother    Hypertension Mother    Cancer Mother        vaginal, vs cervical, vs ovarian dx in her 30s   Congestive Heart Failure Mother        early 62's   Stroke Mother    Coronary artery disease Mother    Hypertension Sister    Coronary artery disease Unknown    Coronary artery disease Maternal Aunt    Colon cancer Maternal Uncle    Prostate cancer Maternal Uncle      SOCIAL HISTORY:  reports that she quit smoking about 15 years ago. She started smoking about 30 years ago. She has a 7.5 pack-year smoking history. She has never used smokeless tobacco. She reports current alcohol use of about 7.0 standard drinks of alcohol per week. She reports that she does not use drugs. The patient divorced and lives in [place].  She ***.    ALLERGIES: Morphine and codeine, Prednisone , and Shellfish allergy   MEDICATIONS:  Current Outpatient Medications  Medication Sig Dispense Refill   albuterol  (PROVENTIL  HFA;VENTOLIN  HFA) 108 (90 Base) MCG/ACT inhaler Inhale 2 puffs into the lungs every 6 (six) hours as needed for wheezing or shortness of breath. (Patient not taking: Reported on 10/16/2017) 1 Inhaler 0   aspirin-acetaminophen -caffeine (EXCEDRIN MIGRAINE) 250-250-65 MG tablet Take 1 tablet by mouth every 6 (six) hours as needed for headache.     bacitracin  ointment Apply 1 application topically 2 (two) times daily. 120 g 0   diphenhydrAMINE  (BENADRYL ) 50 MG capsule Take 1 capsule (50 mg total) by mouth every 8 (eight) hours as needed for itching or sleep. 30 capsule 0    ibuprofen  (ADVIL ,MOTRIN ) 600 MG tablet Take 1 tablet (600 mg total) by mouth every 6 (six) hours as needed. (Patient not taking: Reported on 10/16/2017) 30 tablet 0   metroNIDAZOLE  (FLAGYL ) 500 MG tablet Take 1 tablet (500 mg total) by mouth 2 (two) times daily. 14 tablet 0   Multiple Vitamin (MULTIVITAMIN WITH MINERALS) TABS tablet Take 1 tablet by mouth daily.     pantoprazole  (PROTONIX ) 40 MG tablet Take 1 tablet (40 mg total) by mouth daily. (Patient not taking: Reported on 10/16/2017) 30 tablet 2   No current facility-administered medications for this visit.     REVIEW OF SYSTEMS: On review of systems, the patient reports that she is doing ***     PHYSICAL EXAM:  Wt Readings from Last 3 Encounters:  03/19/21 200 lb (90.7 kg)  08/08/19 207 lb (93.9 kg)  10/16/17 207 lb (93.9 kg)   Temp Readings from Last 3 Encounters:  03/25/24 98.2 F (36.8 C) (Oral)  03/27/21 99.6 F (37.6 C) (Oral)  03/23/21 99.7 F (37.6 C) (Oral)   BP Readings from Last 3 Encounters:  03/25/24 (!) 166/100  03/27/21 (!) 148/85  03/23/21 (!) 164/90   Pulse Readings from Last 3 Encounters:  03/25/24 81  03/27/21 91  03/23/21 69    In general this is a well appearing *** female in no acute distress. She's alert and oriented x4 and appropriate throughout the examination. Cardiopulmonary assessment is negative for acute distress and she exhibits normal effort. Bilateral breast exam is deferred.    ECOG = ***  0 - Asymptomatic (Fully active, able to carry on all predisease activities without restriction)  1 - Symptomatic but completely ambulatory (Restricted in physically strenuous activity but ambulatory and able to carry out work of a light or sedentary nature. For example, light housework, office work)  2 - Symptomatic, <50% in bed during the day (Ambulatory and capable of all self care but unable to carry out any work activities. Up and about more than 50% of waking hours)  3 - Symptomatic, >50%  in bed, but not bedbound (Capable of only limited self-care, confined to bed or chair 50% or more of waking hours)  4 - Bedbound (Completely disabled. Cannot carry on any self-care. Totally confined to bed or chair)  5 - Death   Raylene MM, Creech RH, Tormey DC, et al. [phone]). Toxicity and response criteria of the [facility]. Am. DOROTHA Bridges. Oncol. 5 (6): 649-55    LABORATORY DATA:  Lab Results  Component Value Date   WBC 8.6 03/23/2021   HGB 12.6 03/23/2021   HCT 37.9 03/23/2021   MCV 99.5 03/23/2021   PLT 273 03/23/2021   Lab Results  Component Value Date  NA 138 03/23/2021   K 3.9 03/23/2021   CL 106 03/23/2021   CO2 23 03/23/2021   Lab Results  Component Value Date   ALT 16 12/12/2016   AST 14 12/12/2016   ALKPHOS 58 12/12/2016   BILITOT <0.2 12/12/2016      RADIOGRAPHY: US  LT BREAST BX W LOC DEV 1ST LESION IMG BX SPEC US  GUIDE Addendum Date: 04/13/2024 ADDENDUM REPORT: 04/13/2024 14:15 ADDENDUM: PATHOLOGY revealed: Site 1. Breast, left, needle core biopsy, 3:00 14 cmfn 2.2 cm- INVASIVE MODERATELY DIFFERENTIATED DUCTAL ADENOCARCINOMA, GRADE 2 (3+2+1), OVERALL GRADE: GRADE 2 (6/9), NEGATIVE FOR ANGIOLYMPHATIC INVASION, TUMOR MEASURES 11 MM IN GREATEST LINEAR EXTENT, ADJACENT BREAST SHOWS A DENSE HYALINIZED STROMAL FIBROSIS. Pathology results are CONCORDANT with imaging findings, per Dr. Aliene Mir. PATHOLOGY revealed: Site 2. Breast, left, needle core biopsy, 8:00 4 cmfn 2.6 cm- BENIGN BREAST SHOWING CHANGES CONSISTENT WITH PRIOR PROCEDURE INCLUDING DENSE FIBROTIC HYALINIZED STROMAL FIBROSIS, NEGATIVE FOR MICROCALCIFICATIONS, NEGATIVE FOR ATYPIA AND CARCINOMA. Pathology results are CONCORDANT with imaging findings, per Dr. Aliene Mir. Pathology results and recommendations below were discussed with patient by telephone on 04/13/2024 by Rock Hover RN. Patient reported biopsy site within normal limits with slight tenderness at the site. Post biopsy care  instructions were reviewed, questions were answered and my direct phone number was provided to patient. Patient was instructed to call [facility] if any concerns or questions arise related to the biopsy. RECOMMENDATION: 1. Surgical and oncological consultation. Patient was referred to the [facility] at [facility] with appointment on 04/22/2024 at 12:15. 2. Consider pretreatment bilateral breast MRI with and without contrast to assess extent of breast disease, given the patient's breast cancer history. Pathology results reported by Rock Hover RN on 04/13/2024. Electronically Signed   By: Aliene Lloyd M.D.   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 Ultrasound evaluation identified 2 LEFT breast masses, measuring 2.2 cm (3 o'clock 14 CMFN) and 2.6 cm (8 o'clock 4 CMFN). She presents today for ultrasound-guided biopsy of these masses. EXAM: ULTRASOUND GUIDED LEFT BREAST CORE NEEDLE BIOPSY (x2) COMPARISON:  Previous exam(s). PROCEDURE: I met with the patient and we at length discussed the procedure of ultrasound-guided biopsy, including benefits and alternatives. We discussed the high likelihood of a successful procedure. We discussed the risks of the procedure, including infection, bleeding, tissue injury, implant rupture, and inadequate sampling. Informed written consent was given. The usual time-out protocol was performed immediately prior to the procedure. Site 1: 2.2 cm LEFT breast mass (3 o'clock 14 CMFN) Lesion quadrant: Upper outer quadrant Using sterile technique and 1% Lidocaine  as local anesthetic, under direct ultrasound visualization, a 14 gauge spring-loaded device was used to perform biopsy of 2.2 cm LEFT breast  mass using a lateral approach. Site 2: 2.6 cm LEFT breast mass (8 o'clock 4 CMFN) Lesion quadrant: Upper outer quadrant Using sterile technique and 1% Lidocaine  as local anesthetic, under direct ultrasound visualization, a 14 gauge spring-loaded device was used to perform biopsy of 2.6 cm LEFT breast mass using a lateral approach. No clips were placed as the patient described a possible complication with previous clips, but would not provide specifics due to ongoing litigation. IMPRESSION: Ultrasound guided biopsy of 2.2 cm 2.6 cm LEFT breast masses. No apparent complications. Electronically  Signed: By: Aliene Lloyd M.D. On: 04/10/2024 16:01   US  LT BREAST BX W LOC DEV EA ADD LESION IMG BX SPEC US  GUIDE Addendum Date: 04/13/2024 ADDENDUM REPORT: 04/13/2024 14:15 ADDENDUM: PATHOLOGY revealed: Site 1. Breast, left, needle core biopsy, 3:00 14 cmfn 2.2 cm- INVASIVE MODERATELY DIFFERENTIATED DUCTAL ADENOCARCINOMA, GRADE 2 (3+2+1), OVERALL GRADE: GRADE 2 (6/9), NEGATIVE FOR ANGIOLYMPHATIC INVASION, TUMOR MEASURES 11 MM IN GREATEST LINEAR EXTENT, ADJACENT BREAST SHOWS A DENSE HYALINIZED STROMAL FIBROSIS. Pathology results are CONCORDANT with imaging findings, per Dr. Aliene Mir. PATHOLOGY revealed: Site 2. Breast, left, needle core biopsy, 8:00 4 cmfn 2.6 cm- BENIGN BREAST SHOWING CHANGES CONSISTENT WITH PRIOR PROCEDURE INCLUDING DENSE FIBROTIC HYALINIZED STROMAL FIBROSIS, NEGATIVE FOR MICROCALCIFICATIONS, NEGATIVE FOR ATYPIA AND CARCINOMA. Pathology results are CONCORDANT with imaging findings, per Dr. Aliene Mir. Pathology results and recommendations below were discussed with patient by telephone on 04/13/2024 by Rock Hover RN. Patient reported biopsy site within normal limits with slight tenderness at the site. Post biopsy care instructions were reviewed, questions were answered and my direct phone number was provided to patient. Patient was instructed to call [facility] if any concerns  or questions arise related to the biopsy. RECOMMENDATION: 1. Surgical and oncological consultation. Patient was referred to the [facility] at [facility] with appointment on 04/22/2024 at 12:15. 2. Consider pretreatment bilateral breast MRI with and without contrast to assess extent of breast disease, given the patient's breast cancer history. Pathology results reported by Rock Hover RN on 04/13/2024. Electronically Signed   By: Aliene Lloyd M.D.   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 Ultrasound evaluation identified 2 LEFT breast masses, measuring 2.2 cm (3 o'clock 14 CMFN) and 2.6 cm (8 o'clock 4 CMFN). She presents today for ultrasound-guided biopsy of these masses. EXAM: ULTRASOUND GUIDED LEFT BREAST CORE NEEDLE BIOPSY (x2) COMPARISON:  Previous exam(s). PROCEDURE: I met with the patient and we at length discussed the procedure of ultrasound-guided biopsy, including benefits and alternatives. We discussed the high likelihood of a successful procedure. We discussed the risks of the procedure, including infection, bleeding, tissue injury, implant rupture, and inadequate sampling. Informed written consent was given. The usual time-out protocol was performed immediately prior to the procedure. Site 1: 2.2 cm LEFT breast mass (3 o'clock 14 CMFN) Lesion quadrant: Upper outer quadrant Using sterile technique and 1% Lidocaine  as local anesthetic, under direct ultrasound visualization, a 14 gauge spring-loaded device was used to perform biopsy of 2.2 cm LEFT breast mass using a lateral approach. Site 2: 2.6 cm LEFT breast mass (8 o'clock 4 CMFN) Lesion quadrant: Upper outer quadrant Using sterile technique and 1% Lidocaine  as local anesthetic,  under direct ultrasound visualization, a 14 gauge spring-loaded device was used to perform biopsy of 2.6 cm LEFT breast mass using a lateral approach. No clips were placed as the patient described a possible complication with previous clips, but would not provide specifics due to ongoing litigation. IMPRESSION: Ultrasound guided biopsy of 2.2 cm 2.6 cm LEFT breast masses. No apparent complications. Electronically Signed: By: Aliene Lloyd M.D. On: 04/10/2024 16:01   US  LIMITED ULTRASOUND INCLUDING AXILLA LEFT BREAST  Result Date: 04/01/2024 CLINICAL DATA:  48 year old female with history of high-grade DCIS of the left breast in 2019 status post  bilateral mastectomies with implant reconstruction done at [facility] presenting today with 2 palpable areas in the left breast. One palpable area at 4 o'clock is new per the patient and the second larger area at 8 o'clock patient says has been there since her reconstruction surgery. EXAM: ULTRASOUND OF THE left reconstructed BREAST COMPARISON:  Prior mammogram and right breast ultrasound from 2019 FINDINGS: On physical exam, there is a hard 2 cm firm nodular mass along the posterior left reconstructed breast at 3 o'clock 14 cm from the nipple. There is a second palpable area from 6-8 o'clock in the left breast about 4 cm from the nipple which is flat and ovoid. Targeted ultrasound of the hard firm mass at 3 o'clock demonstrates an irregular 1.7 by 2.2 x 1.5 cm hypoechoic mass with a hyperechoic rim. There is internal blood flow. Findings are suspicious for recurrent malignancy. In the left breast 8 o'clock 4 cm from the nipple at the more flat ovoid palpable abnormality there is a oval hypoechoic 2.8 x 0.8 by 2.6 cm mixed echotexture mass. There does appear to be a small internal cystic component. IMPRESSION: Highly suspicious 2.2 cm palpable mass in the left breast at 3 o'clock 14 cm from the nipple concerning for recurrent malignancy. Suspicious ovoid hypoechoic 2.6 cm mass in  the left breast at 8 o'clock 4 cm from the nipple. RECOMMENDATION: Ultrasound-guided biopsy of both left breast masses is recommended to rule out malignancy. I have discussed the findings and recommendations with the patient. If applicable, a reminder letter will be sent to the patient regarding the next appointment. BI-RADS CATEGORY  5: Highly suggestive of malignancy. Electronically Signed   By: Rosina Gelineau M.D.   On: 04/01/2024 15:32       IMPRESSION/PLAN: 1. Locally recurrent grade 2 ER/PR positive invasive ductal carcinoma of the left breast in the setting of previous DCIS of the left breast. I discussed the pathology findings and reviews the nature of *** breast disease. The consensus from the breast conference includes breast conservation with lumpectomy with *** sentinel node biopsy. Depending on the size of the final tumor measurements rendered by pathology, the tumor may be tested for Oncotype Dx score to determine a role for systemic therapy. Provided that chemotherapy is not indicated, the patient's course would then be followed by external radiotherapy to the breast  to reduce risks of local recurrence. Dr. PIERRETTE anticipates adjuvant antiestrogen therapy to follow. We discussed the risks, benefits, short, and long term effects of radiotherapy, as well as the curative intent, and the patient is interested in proceeding. I reviewed the delivery and logistics of radiotherapy and anticipates a course of *** weeks of radiotherapy. We will see her back a few weeks after surgery to discuss the simulation process and anticipate we starting radiotherapy about 4-6 weeks after surgery.   2. Possible genetic predisposition to malignancy. The patient is a candidate for genetic testing given *** personal and family history. She will meet with our geneticist today in clinic.   In a visit lasting *** minutes, greater than 50% of the time was spent face to face reviewing her case, as well as in preparation  of, discussing, and coordinating the patient's care.   ________________________________   Norleen CANDIE Limes, MD, PhD     **Disclaimer: This note was dictated with voice recognition software. Similar sounding words can inadvertently be transcribed and this note may contain transcription errors which may not have been corrected upon publication of note.**

## 2024-04-22 ENCOUNTER — Encounter: Payer: Self-pay | Admitting: General Practice

## 2024-04-22 ENCOUNTER — Encounter: Payer: Self-pay | Admitting: *Deleted

## 2024-04-22 ENCOUNTER — Ambulatory Visit
Admission: RE | Admit: 2024-04-22 | Discharge: 2024-04-22 | Disposition: A | Discharge status: Home / Self Care | Source: Ambulatory Visit | Attending: Radiation Oncology | Admitting: Radiation Oncology

## 2024-04-22 ENCOUNTER — Inpatient Hospital Stay: Attending: Hematology | Admitting: Hematology

## 2024-04-22 ENCOUNTER — Inpatient Hospital Stay

## 2024-04-22 ENCOUNTER — Encounter: Payer: Self-pay | Admitting: Hematology

## 2024-04-22 ENCOUNTER — Other Ambulatory Visit: Payer: Self-pay | Admitting: *Deleted

## 2024-04-22 ENCOUNTER — Ambulatory Visit: Admitting: Physical Therapy

## 2024-04-22 VITALS — BP 163/95 | HR 95 | Temp 98.1°F | Resp 183 | Ht 69.29 in | Wt 216.9 lb

## 2024-04-22 DIAGNOSIS — Z87891 Personal history of nicotine dependence: Secondary | ICD-10-CM | POA: Diagnosis not present

## 2024-04-22 DIAGNOSIS — Z800 Family history of malignant neoplasm of digestive organs: Secondary | ICD-10-CM | POA: Diagnosis not present

## 2024-04-22 DIAGNOSIS — Z170 Estrogen receptor positive status [ER+]: Secondary | ICD-10-CM

## 2024-04-22 DIAGNOSIS — Z9013 Acquired absence of bilateral breasts and nipples: Secondary | ICD-10-CM | POA: Insufficient documentation

## 2024-04-22 DIAGNOSIS — F1120 Opioid dependence, uncomplicated: Secondary | ICD-10-CM | POA: Insufficient documentation

## 2024-04-22 DIAGNOSIS — Z90710 Acquired absence of both cervix and uterus: Secondary | ICD-10-CM | POA: Insufficient documentation

## 2024-04-22 DIAGNOSIS — C50412 Malignant neoplasm of upper-outer quadrant of left female breast: Secondary | ICD-10-CM

## 2024-04-22 DIAGNOSIS — Z8042 Family history of malignant neoplasm of prostate: Secondary | ICD-10-CM | POA: Insufficient documentation

## 2024-04-22 DIAGNOSIS — Z1721 Progesterone receptor positive status: Secondary | ICD-10-CM | POA: Insufficient documentation

## 2024-04-22 DIAGNOSIS — I890 Lymphedema, not elsewhere classified: Secondary | ICD-10-CM | POA: Insufficient documentation

## 2024-04-22 DIAGNOSIS — D0512 Intraductal carcinoma in situ of left breast: Secondary | ICD-10-CM

## 2024-04-22 LAB — CBC WITH DIFFERENTIAL (CANCER CENTER ONLY)
Abs Immature Granulocytes: 0.02 K/uL (ref 0.00–0.07)
Basophils Absolute: 0.1 K/uL (ref 0.0–0.1)
Basophils Relative: 1 %
Eosinophils Absolute: 0.1 K/uL (ref 0.0–0.5)
Eosinophils Relative: 1 %
HCT: 37.6 % (ref 36.0–46.0)
Hemoglobin: 13.4 g/dL (ref 12.0–15.0)
Immature Granulocytes: 0 %
Lymphocytes Relative: 27 %
Lymphs Abs: 1.9 K/uL (ref 0.7–4.0)
MCH: 32.7 pg (ref 26.0–34.0)
MCHC: 35.6 g/dL (ref 30.0–36.0)
MCV: 91.7 fL (ref 80.0–100.0)
Monocytes Absolute: 0.4 K/uL (ref 0.1–1.0)
Monocytes Relative: 6 %
Neutro Abs: 4.6 K/uL (ref 1.7–7.7)
Neutrophils Relative %: 65 %
Platelet Count: 371 K/uL (ref 150–400)
RBC: 4.1 MIL/uL (ref 3.87–5.11)
RDW: 11.9 % (ref 11.5–15.5)
WBC Count: 7.1 K/uL (ref 4.0–10.5)
nRBC: 0 % (ref 0.0–0.2)

## 2024-04-22 LAB — CMP (CANCER CENTER ONLY)
ALT: 16 U/L (ref 0–44)
AST: 14 U/L — ABNORMAL LOW (ref 15–41)
Albumin: 3.9 g/dL (ref 3.5–5.0)
Alkaline Phosphatase: 60 U/L (ref 38–126)
Anion gap: 5 (ref 5–15)
BUN: 9 mg/dL (ref 6–20)
CO2: 26 mmol/L (ref 22–32)
Calcium: 9 mg/dL (ref 8.9–10.3)
Chloride: 106 mmol/L (ref 98–111)
Creatinine: 0.86 mg/dL (ref 0.44–1.00)
GFR, Estimated: >60 mL/min (ref >60)
Glucose, Bld: 100 mg/dL — ABNORMAL HIGH (ref 70–99)
Potassium: 3.6 mmol/L (ref 3.5–5.1)
Sodium: 137 mmol/L (ref 135–145)
Total Bilirubin: 0.3 mg/dL (ref 0.0–1.2)
Total Protein: 7.3 g/dL (ref 6.5–8.1)

## 2024-04-22 NOTE — Progress Notes (Signed)
 [facility]isciplinary [facility] Spiritual Care Note  Met with Amber Villarreal in Breast Multidisciplinary Clinic to introduce Support Center team/resources.  she completed SDOH screening; results follow below.    SDOH Screenings   Food Insecurity: No Food Insecurity (04/22/2024)  Housing: High Risk (04/22/2024)  Transportation Needs: Unmet Transportation Needs (04/22/2024)  Utilities: At Risk (04/22/2024)  Depression (PHQ2-9): Low Risk  (04/22/2024)  Tobacco Use: Medium Risk (04/22/2024)   Chaplain and patient discussed common feelings and emotions when being diagnosed with cancer, and the importance of support during treatment.  Chaplain informed patient of the support team and support services at [facility].  Chaplain provided contact information and encouraged patient to call with any questions or concerns. Referred to Social Work re financial and transportation concerns.  Follow up needed: Yes.  We plan to follow up by phone in ca two weeks for spiritual/emotional check-in/follow-up support.   [address] Amber Villarreal, [place], [facility] Pager [phone] Voicemail [phone]

## 2024-04-22 NOTE — Research (Signed)
 Trial:  [employer] 2021-05 - Specimen Collection Study to Evaluate Biomarkers in Subjects with Cancer   Patient Amber Villarreal was identified by Dr. Lanny as a potential candidate for the above listed study.  This Clinical Research Nurse met with Amber Villarreal, [id number], on 04/22/24 in a manner and location that ensures patient privacy to discuss participation in the above listed research study.  Patient is Unaccompanied.  A copy of the informed consent document with embedded HIPAA language was provided to the patient.  Patient reads, speaks, and understands English.   Approximately 5 minutes were spent with the patient introducing the study.  Patient was provided the option of taking informed consent documents home to review and was encouraged to review at their convenience with their support network, including other care providers. Patient is comfortable with making a decision regarding study participation today. Patient declined participation stating she was not interested. Dr. Lanny notified.  Cherylyn Hoard, BSN, RN, [facility] Clinical Research Nurse II [phone] 04/22/2024 3:30 PM'

## 2024-04-22 NOTE — Progress Notes (Signed)
 Breast Multidisciplinary Clinic- Patient with Previous Genetic Testing I saw Amber Villarreal today during her breast multidisciplinary clinic visit. She has a personal history of recurrent left breast cancer. She was first diagnosed in 2019 and completed genetic testing at that time, which was negative. We briefly discussed the option of updated testing with a broader panel, but since her 2019 panel was inclusive of the primary genes associated with cancer risks for the cancers seen in her personal and family which include breast, ovarian, colon and prostate cancers each of which are found in her family history. Below are details about her prior genetic testing.  Genetic Testing RESULTS: Negative Genetic Testing  Report Date: Nov 12, 2017. Lab: [facility] Tests Ordered:  [facility] Breast Cancer STAT Panel, [facility] Common Hereditary Cancers Panel (47 Genes), Add-on ATM Gene, Add-on CHEK2 Gene  Number of Genes on Panel: 47 Panel Details:  Includes sequencing and/or deletion duplication testing of the following 47 genes: APC, ATM, AXIN2, BARD1, BMPR1A, BRCA1, BRCA2, BRIP1, CDH1, CDK4, CDKN2A (p14ARF), CDKN2A (p16INK4a), CHEK2, CTNNA1, DICER1, EPCAM (Deletion/duplication testing only), GREM1 (promoter region deletion/duplication testing only), KIT, MEN1, MLH1, MSH2, MSH3, MSH6, MUTYH, NBN, NF1, NHTL1, PALB2, PDGFRA, PMS2, POLD1, POLE, PTEN, RAD50, RAD51C, RAD51D, SDHB, SDHC, SDHD, SMAD4, SMARCA4. STK11, TP53, TSC1, TSC2, and VHL.  The following genes were evaluated for sequence changes only: SDHA and HOXB13 c.251G>A variant only.   Oncology History Overview Note  Cancer Staging Ductal carcinoma in situ (DCIS) of left breast Staging form: Breast, AJCC 8th Edition - Clinical stage from 10/07/2017: Stage 0 (cTis (DCIS), cN0, cM0, ER+, PR+, HER2: Not Assessed) - Signed by Lanny Callander, MD on 10/16/2017     Ductal carcinoma in situ (DCIS) of left breast  09/18/2017 Mammogram   IMPRESSION: 1. Large area of  segmentally distributed pleomorphic calcifications within the upper-outer left breast extending from the posterior aspect of the breast to the nipple. 2. Suspicious palpable right breast mass.   10/07/2017 Receptors her2   Prognostic indicators significant for: ER, 95% positive and PR, 90% positive, both with strong staining intensity    10/07/2017 Initial Biopsy   Diagnosis 10/07/17 1. Breast, left, needle core biopsy, upper outer posterior calcs - DUCTAL CARCINOMA IN SITU (DCIS), HIGH GRADE WITH CALCIFICATIONS. SEE NOTE. - NEGATIVE FOR INVASIVE CARCINOMA. 2. Breast, left, needle core biopsy, upper outer anterior calcs - DUCTAL CARCINOMA IN SITU (DCIS), INTERMEDIATE GRADE WITH CALCIFICATIONS. SEE NOTE. - NEGATIVE FOR INVASIVE CARCINOMA. 3. Breast, right, needle core biopsy, 2 o'clock mass - BENIGN BREAST TISSUE WITH FIBROCYSTIC CHANGE, INCLUDING ADENOSIS. - NEGATIVE FOR CARCINOMA.   10/07/2017 Cancer Staging   Staging form: Breast, AJCC 8th Edition - Clinical stage from 10/07/2017: Stage 0 (cTis (DCIS), cN0, cM0, ER+, PR+, HER2: Not Assessed) - Signed by Lanny Callander, MD on 10/16/2017   10/10/2017 Initial Diagnosis   Ductal carcinoma in situ (DCIS) of left breast   Malignant neoplasm of upper-outer quadrant of left breast in female, estrogen receptor positive (HCC)  04/10/2024 Cancer Staging   Staging form: Breast, AJCC 8th Edition - Clinical stage from 04/10/2024: Stage IB (cT2, cN0, cM0, G2, ER+, PR+, HER2-) - Signed by Lanny Callander, MD on 04/22/2024 Stage prefix: Initial diagnosis Histologic grading system: 3 grade system   04/20/2024 Initial Diagnosis   Malignant neoplasm of upper-outer quadrant of left breast in female, estrogen receptor positive (HCC)    Amber Villarreal Lindsey-Mills, MS, CGC  Certified Genetic Counselor  Email: [email or web address]  Phone: [phone]
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
## 2024-04-23 LAB — GENETIC SCREENING ORDER

## 2024-04-28 ENCOUNTER — Encounter: Payer: Self-pay | Admitting: Plastic Surgery

## 2024-04-28 ENCOUNTER — Ambulatory Visit: Admitting: Plastic Surgery

## 2024-04-28 DIAGNOSIS — D0512 Intraductal carcinoma in situ of left breast: Secondary | ICD-10-CM

## 2024-04-28 NOTE — Progress Notes (Signed)
 No show

## 2024-04-28 NOTE — Progress Notes (Deleted)
     Patient ID: Aarian Cleaver, female    DOB: Jul 19, 1975, 48 y.o.   MRN: [id number]   No chief complaint on file.   HPI  Review of Systems  Past Medical History:  Diagnosis Date   Anemia    history of   Bipolar disorder, unspecified (HCC)    Breast cancer (HCC)    breast   Family history of prostate cancer    Hx of cardiovascular stress test    a. Lex MV 2/14: EF 50%, no ischemia, small L breast nodule   Opioid type dependence, unspecified     Past Surgical History:  Procedure Laterality Date   BREAST BIOPSY Left 04/10/2024   US  LT BREAST BX W LOC DEV 1ST LESION IMG BX SPEC US  GUIDE 04/10/2024 [facility]   BREAST BIOPSY Left 04/10/2024   US  LT BREAST BX W LOC DEV EA ADD LESION IMG BX SPEC US  GUIDE 04/10/2024 [facility]   DILATION AND CURETTAGE OF UTERUS     MASTECTOMY Bilateral 04/15/2018      Current Outpatient Medications:    aspirin-acetaminophen -caffeine (EXCEDRIN MIGRAINE) 250-250-65 MG tablet, Take 1 tablet by mouth every 6 (six) hours as needed for headache., Disp: , Rfl:    bacitracin  ointment, Apply 1 application topically 2 (two) times daily., Disp: 120 g, Rfl: 0   Multiple Vitamin (MULTIVITAMIN WITH MINERALS) TABS tablet, Take 1 tablet by mouth daily., Disp: , Rfl:    Objective:   There were no vitals filed for this visit.  Physical Exam  Assessment & Plan:  No diagnosis found.  ***  Estefana RAMAN Nashia Remus, DO

## 2024-04-28 NOTE — Therapy (Signed)
 OUTPATIENT PHYSICAL THERAPY  UPPER EXTREMITY ONCOLOGY EVALUATION  Patient Name: Amber Villarreal MRN: [id number] DOB:10/10/1975, 48 y.o., female Today's Date: 04/29/2024  END OF SESSION:  PT End of Session - 04/29/24 1211     Visit Number 1    Number of Visits 19    Date for Recertification  06/10/24    PT Start Time 1102    PT Stop Time 1211    PT Time Calculation (min) 69 min    Activity Tolerance Patient tolerated treatment well    Behavior During Therapy WFL for tasks assessed/performed          Past Medical History:  Diagnosis Date   Anemia    history of   Bipolar disorder, unspecified (HCC)    Breast cancer (HCC)    breast   Family history of prostate cancer    Hx of cardiovascular stress test    a. Lex MV 2/14: EF 50%, no ischemia, small L breast nodule   Opioid type dependence, unspecified    Past Surgical History:  Procedure Laterality Date   BREAST BIOPSY Left 04/10/2024   US  LT BREAST BX W LOC DEV 1ST LESION IMG BX SPEC US  GUIDE 04/10/2024 [facility]   BREAST BIOPSY Left 04/10/2024   US  LT BREAST BX W LOC DEV EA ADD LESION IMG BX SPEC US  GUIDE 04/10/2024 [facility]   DILATION AND CURETTAGE OF UTERUS     MASTECTOMY Bilateral 04/15/2018   Patient Active Problem List   Diagnosis Date Noted   Malignant neoplasm of upper-outer quadrant of left breast in female, estrogen receptor positive (HCC) 04/20/2024   Genetic testing 11/12/2017   Family history of prostate cancer    Ductal carcinoma in situ (DCIS) of left breast 10/10/2017   Chalazion of right upper eyelid 05/07/2017   Generalized muscle ache 05/07/2017   Raynaud's syndrome 05/07/2017   Contraceptive management 04/05/2016   Family history of malignant neoplasm of gastrointestinal tract 10/07/2015   Melanocytic nevi of right upper limb, including shoulder 10/07/2015   Alcohol abuse 09/04/2013   High risk sexual behavior 09/19/2012   Breast nodule 08/04/2012   Obesity 06/01/2011    BIPOLAR DISORDER UNSPECIFIED 06/10/2008    PCP: none  REFERRING PROVIDER: Curvin Deward MOULD, MD  REFERRING DIAG: Left Breast Cancer/Lymphedema  THERAPY DIAG:  Postmastectomy lymphedema - Plan: PT plan of care cert/re-cert  Stiffness of left shoulder, not elsewhere classified - Plan: PT plan of care cert/re-cert  Abnormal posture - Plan: PT plan of care cert/re-cert  ONSET DATE: 2021  Rationale for Evaluation and Treatment: Rehabilitation  SUBJECTIVE:  SUBJECTIVE STATEMENT: I have been having swelling in my L arm for a couple of years. I have not had treatment for it. The swelling is usually about the same but sometimes it is more swollen than others.   PERTINENT HISTORY: Hx of bilateral mastectomies and left axillary lymph node dissection 04/15/2018. She recently felt a mass in the upper outer quadrant of the left reconstructed breast. She has a 2.2 cm recurrence IDC ER/PR+, HER2-, Ki67 3%.  PAIN:  Are you having pain? No  PRECAUTIONS: Other: LUE lymphedema  RED FLAGS: None   WEIGHT BEARING RESTRICTIONS: No  FALLS:  Has patient fallen in last 6 months? No  LIVING ENVIRONMENT: Lives with: lives with their family son 63, daughter 17 Lives in: House/apartment Has following equipment at home: None  OCCUPATION: disability  LEISURE: walks several times a week   HAND DOMINANCE: right   PRIOR LEVEL OF FUNCTION: Independent  PATIENT GOALS: to get the fluid out of my arm   OBJECTIVE: Note: Objective measures were completed at Evaluation unless otherwise noted.  COGNITION: Overall cognitive status: Within functional limits for tasks assessed   PALPATION: Fibrosis at L forearm  OBSERVATIONS / OTHER ASSESSMENTS: Fullness present throughout LUE and L lateral breast/trunk  POSTURE: forward  head, rounded shoulders  UPPER EXTREMITY AROM/PROM:  A/PROM RIGHT   eval   Shoulder extension 75  Shoulder flexion 174  Shoulder abduction 179  Shoulder internal rotation 62  Shoulder external rotation 90    (Blank rows = not tested)  A/PROM LEFT   eval  Shoulder extension 52  Shoulder flexion 171  Shoulder abduction 174  Shoulder internal rotation 48  Shoulder external rotation 90    (Blank rows = not tested)  CERVICAL AROM: All within normal limits:    Percent limited  Flexion WFL  Extension WFL  Right lateral flexion WFL  Left lateral flexion WFL  Right rotation WFL  Left rotation WFL    UPPER EXTREMITY STRENGTH:   LYMPHEDEMA ASSESSMENTS:   SURGERY TYPE/DATE: 01/2018 bilateral mastectomy and implant placement, 04/15/2018 - ALND  NUMBER OF LYMPH NODES REMOVED: pt reports all nodes were removed during the ALND in October 2019  CHEMOTHERAPY: none  RADIATION:none  HORMONE TREATMENT: was prescribed tamoxifen but pt reports she took it for a couple of months  INFECTIONS: none   LYMPHEDEMA ASSESSMENTS:   LANDMARK RIGHT  eval  At axilla  37.5  15 cm proximal to the proximal aspect of the olecranon process 34.4  10 cm proximal to the proximal aspect of the olecranon process 32.5  Olecranon process 26.5  15 cm proximal to the proximal aspect of the ulnar styloid process 26.5  10 cm proximal to the proximal aspect of the ulnar styloid process 22.8  Just distal to the ulnar styloid process 16.5  Across hand at thumb web space 20.5  At base of 2nd digit 6.5  (Blank rows = not tested)  LANDMARK LEFT  eval  At axilla  39  15 cm proximal to the proximal aspect of the olecranon process 37  10 cm proximal to the proximal aspect of the olecranon process 34.5  Olecranon process 31  15 cm proximal to the proximal aspect of the ulnar styloid process 31.5  10 cm proximal to  the proximal aspect of the ulnar styloid process 27.8  Just distal to the ulnar styloid  process 20  Across hand at thumb web space 22.5  At base of 2nd digit 7  (Blank rows = not  tested)  Chest circumference just inferior to the axillae: 108.5 cm Chest circumference at the largest point:  118 cm   QUICK DASH SURVEY:   Amber Villarreal - 04/29/24 0001     Open a tight or new jar Moderate difficulty    Do heavy household chores (wash walls, wash floors) Moderate difficulty    Carry a shopping bag or briefcase Moderate difficulty    Wash your back Moderate difficulty    Use a knife to cut food No difficulty    Recreational activities in which you take some force or impact through your arm, shoulder, or hand (golf, hammering, tennis) Moderate difficulty    During the past week, to what extent has your arm, shoulder or hand problem interfered with your normal social activities with family, friends, neighbors, or groups? Modererately    During the past week, to what extent has your arm, shoulder or hand problem limited your work or other regular daily activities Modererately    Arm, shoulder, or hand pain. Mild    Tingling (pins and needles) in your arm, shoulder, or hand Mild    Difficulty Sleeping Mild difficulty    DASH Score 38.64 %                                                                                                                                     TREATMENT DATE:  04/29/24- see education section    PATIENT EDUCATION:  Education details: lymphedema and how it is managed with MLD and compression, post op exercises, lymphedema risk reduction, ABC class, how to obtain a compression bra [place] educated: Patient Education method: Explanation and Handouts Education comprehension: verbalized understanding  HOME EXERCISE PROGRAM: Post op exercises  ASSESSMENT:  CLINICAL IMPRESSION: Patient is a 48 y.o. female who was seen today for physical therapy evaluation and treatment for LUE lymphedema. Pt has a previous history of bilateral mastectomy and L ALND in  2019 for treatment of L breast cancer. She developed L UE, lateral breast and trunk lymphedema in 2021. She has decreased L UE extension. She now has recurrence of L breast cancer and will need surgery. She will require compression bandaging during surgery and after to keep her lymphedema from worsening. She would benefit from skilled PT services 3x/wk for complete CDT which includes compression bandaging and MLD to decrease lymphedema.    OBJECTIVE IMPAIRMENTS: decreased knowledge of condition, decreased knowledge of use of DME, decreased ROM, increased edema, and postural dysfunction.   ACTIVITY LIMITATIONS: none  PARTICIPATION LIMITATIONS: none  PERSONAL FACTORS: Time since onset of injury/illness/exacerbation are also affecting patient's functional outcome.   REHAB POTENTIAL: Good  CLINICAL DECISION MAKING: Evolving/moderate complexity  EVALUATION COMPLEXITY: Moderate  GOALS: Goals reviewed with patient? Yes  SHORT TERM GOALS: Target date: 05/20/24  Pt will be independent in donning compression bandages for management of lymphedema. Baseline: Goal status: INITIAL  2.  Pt will demonstrate a  1 cm reduction in lymphedema at hand to decrease risk of infection. Baseline:  Goal status: INITIAL  3.  Pt will be independent in self MLD for long term management of lymphedema.  Baseline:  Goal status: INITIAL  LONG TERM GOALS: Target date: 06/10/24  Pt will obtain appropriate compression garments for day and night time for long term management of lymphedema. Baseline:  Goal status: INITIAL  2. Pt will demo she has regained full shoulder ROM and function post operatively compared to baselines.  Baseline:  Goal status: INITIAL  3.  Pt will be able to independently manage her lymphedema through use of self MLD and compression garments to decrease risk of lymphedema.  Baseline:  Goal status: INITIAL  4.  Pt will demonstrate a 2 cm decrease in edema just distal to ulnar styloid  process to decrease risk of infection.  Baseline:  Goal status: INITIAL   PLAN:  PT FREQUENCY: 3x/week  PT DURATION: 6 weeks  PLANNED INTERVENTIONS: 97164- PT Re-evaluation, 97110-Therapeutic exercises, 97530- Therapeutic activity, 97112- Neuromuscular re-education, 97535- Self Care, [id number]- Manual therapy, [phone]- Orthotic Initial, [phone]- Orthotic/Prosthetic subsequent, Patient/Family education, Balance training, Joint mobilization, Therapeutic exercises, Therapeutic activity, Neuromuscular re-education, Gait training, and Self Care  PLAN FOR NEXT SESSION: begin CDT to LUE and instruct pt in bandaging in order to decrease to 2x/wk   Smiley Birr Breedlove Blue, PT 04/29/2024, 12:25 PM

## 2024-04-29 ENCOUNTER — Ambulatory Visit: Attending: General Surgery | Admitting: Physical Therapy

## 2024-04-29 ENCOUNTER — Encounter: Payer: Self-pay | Admitting: Physical Therapy

## 2024-04-29 ENCOUNTER — Other Ambulatory Visit: Payer: Self-pay

## 2024-04-29 DIAGNOSIS — M25612 Stiffness of left shoulder, not elsewhere classified: Secondary | ICD-10-CM | POA: Diagnosis not present

## 2024-04-29 DIAGNOSIS — I972 Postmastectomy lymphedema syndrome: Secondary | ICD-10-CM | POA: Diagnosis present

## 2024-04-29 DIAGNOSIS — R293 Abnormal posture: Secondary | ICD-10-CM | POA: Diagnosis not present

## 2024-04-30 ENCOUNTER — Encounter (HOSPITAL_COMMUNITY): Payer: Self-pay

## 2024-04-30 ENCOUNTER — Ambulatory Visit (HOSPITAL_COMMUNITY)
Admission: RE | Admit: 2024-04-30 | Discharge: 2024-04-30 | Disposition: A | Discharge status: Home / Self Care | Source: Ambulatory Visit | Attending: Hematology | Admitting: Hematology

## 2024-04-30 ENCOUNTER — Encounter (HOSPITAL_COMMUNITY)
Admission: RE | Admit: 2024-04-30 | Discharge: 2024-04-30 | Disposition: A | Discharge status: Home / Self Care | Source: Ambulatory Visit | Attending: Hematology | Admitting: Hematology

## 2024-04-30 DIAGNOSIS — Z170 Estrogen receptor positive status [ER+]: Secondary | ICD-10-CM | POA: Insufficient documentation

## 2024-04-30 DIAGNOSIS — C50412 Malignant neoplasm of upper-outer quadrant of left female breast: Secondary | ICD-10-CM | POA: Diagnosis present

## 2024-04-30 LAB — GLUCOSE, CAPILLARY: Glucose-Capillary: 96 mg/dL (ref 70–99)

## 2024-04-30 MED ORDER — FLUDEOXYGLUCOSE F - 18 (FDG) INJECTION
10.7300 | Freq: Once | INTRAVENOUS | Status: AC | PRN
  Administered 2024-04-30: 10.73 via INTRAVENOUS

## 2024-05-01 ENCOUNTER — Encounter: Payer: Self-pay | Admitting: *Deleted

## 2024-05-01 ENCOUNTER — Telehealth: Payer: Self-pay | Admitting: *Deleted

## 2024-05-01 ENCOUNTER — Institutional Professional Consult (permissible substitution): Admitting: Plastic Surgery

## 2024-05-01 NOTE — Telephone Encounter (Signed)
 Spoke with patient to follow up from [facility] 10/29 and assess navigation needs. Patient was unable to complete MRI breat due to a lot of pressure on her chest and she states she couldn't breath and lay there for amount of time needed. Informed I would let Dr. Yvonne office know and that we could possibly do a CEM instead. Patient verbalized understanding.  No other questions at this time. Encouraged her to call should anything arise

## 2024-05-04 ENCOUNTER — Ambulatory Visit: Admitting: Physical Therapy

## 2024-05-04 ENCOUNTER — Other Ambulatory Visit: Payer: Self-pay | Admitting: *Deleted

## 2024-05-04 DIAGNOSIS — C50412 Malignant neoplasm of upper-outer quadrant of left female breast: Secondary | ICD-10-CM

## 2024-05-05 ENCOUNTER — Other Ambulatory Visit: Payer: Self-pay | Admitting: General Surgery

## 2024-05-05 ENCOUNTER — Ambulatory Visit
Admission: RE | Admit: 2024-05-05 | Discharge: 2024-05-05 | Disposition: A | Source: Ambulatory Visit | Attending: General Surgery | Admitting: General Surgery

## 2024-05-05 ENCOUNTER — Ambulatory Visit: Payer: Self-pay | Admitting: Hematology

## 2024-05-05 ENCOUNTER — Encounter: Payer: Self-pay | Admitting: *Deleted

## 2024-05-05 ENCOUNTER — Telehealth: Payer: Self-pay | Admitting: *Deleted

## 2024-05-05 ENCOUNTER — Ambulatory Visit: Admitting: Plastic Surgery

## 2024-05-05 ENCOUNTER — Other Ambulatory Visit: Payer: Self-pay | Admitting: Hematology

## 2024-05-05 VITALS — BP 147/88 | HR 87 | Ht 69.0 in | Wt 216.8 lb

## 2024-05-05 DIAGNOSIS — Z9013 Acquired absence of bilateral breasts and nipples: Secondary | ICD-10-CM | POA: Diagnosis not present

## 2024-05-05 DIAGNOSIS — C50412 Malignant neoplasm of upper-outer quadrant of left female breast: Secondary | ICD-10-CM

## 2024-05-05 DIAGNOSIS — Z170 Estrogen receptor positive status [ER+]: Secondary | ICD-10-CM

## 2024-05-05 MED ORDER — IOPAMIDOL (ISOVUE-370) INJECTION 76%
100.0000 mL | Freq: Once | INTRAVENOUS | Status: AC | PRN
  Administered 2024-05-05: 100 mL via INTRAVENOUS

## 2024-05-05 NOTE — Telephone Encounter (Signed)
 Spoke with patient regarding PET results and CEM.  Informed patient doesn't look like any metastatic dz. There were bil ?cystic lesions in the area of the ovaries that were indeterminate and pelvic MRI was rec in which patient doesn't want to do at this time.  Informed her that the CEM looks to be only known dz.  Team notified. Encouraged her to call with any questions or concerns.

## 2024-05-05 NOTE — Progress Notes (Signed)
 Patient ID: Amber Villarreal, female    DOB: 24-Nov-1975, 48 y.o.   MRN: [id number]   Chief Complaint  Patient presents with   consult    The patient is a 48 year old female here today for evaluation of her breasts.  In 2019 the patient had bilateral mastectomies at [facility].  She had reconstruction with 800 cc gel implants and multiple attempts at fat grafting.  She has her card but did not bring it with her today for her implant sizes and types.  She does have more imaging scheduled.  According to the notes the patient noticed an area of her left breast that did not feel right and she went for evaluation which included an ultrasound that was biopsied.  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  I do feel the area that she is concerned about on the left side.     Review of Systems  Constitutional: Negative.   HENT: Negative.    Eyes: Negative.   Respiratory: Negative.    Cardiovascular: Negative.   Gastrointestinal: Negative.   Endocrine: Negative.   Genitourinary: Negative.   Musculoskeletal: Negative.     Past Medical History:  Diagnosis Date   Anemia    history of   Bipolar disorder, unspecified (HCC)    Breast cancer (HCC)    breast   Family history of prostate cancer    Hx of cardiovascular stress test    a. Lex MV 2/14: EF 50%, no ischemia, small L breast nodule   Opioid type dependence, unspecified     Past Surgical History:  Procedure Laterality Date   BREAST BIOPSY Left 04/10/2024   US  LT BREAST BX W LOC DEV 1ST LESION IMG BX SPEC US  GUIDE 04/10/2024 [facility]   BREAST BIOPSY Left 04/10/2024   US  LT BREAST BX W LOC DEV EA ADD LESION IMG BX SPEC US  GUIDE 04/10/2024 [facility]   DILATION AND CURETTAGE OF UTERUS     MASTECTOMY Bilateral 04/15/2018      Current Outpatient Medications:    aspirin-acetaminophen -caffeine (EXCEDRIN MIGRAINE) 250-250-65 MG tablet,  Take 1 tablet by mouth every 6 (six) hours as needed for headache., Disp: , Rfl:    bacitracin  ointment, Apply 1 application topically 2 (two) times daily., Disp: 120 g, Rfl: 0   Ginger, Zingiber officinalis, (GINGER ROOT PO), Take by mouth., Disp: , Rfl:    KRILL OIL PO, Take by mouth., Disp: , Rfl:    Multiple Vitamin (MULTIVITAMIN WITH MINERALS) TABS tablet, Take 1 tablet by mouth daily., Disp: , Rfl:    Zinc  Sulfate 66 (15 Zn) MG TABS, Take by mouth., Disp: , Rfl:    Objective:   Vitals:   05/05/24 1132  BP: (!) 147/88  Pulse: 87  SpO2: 98%    Physical Exam Vitals reviewed.  Constitutional:      Appearance: Normal appearance.  HENT:     Head: Atraumatic.  Cardiovascular:     Rate and Rhythm: Normal rate.     Pulses: Normal pulses.  Pulmonary:     Effort: Pulmonary effort is normal.  Abdominal:     Palpations: Abdomen is soft.  Musculoskeletal:        General: No deformity.  Skin:    General: Skin is warm.  Neurological:     Mental Status: She is alert and oriented to [place], place, and time.  Psychiatric:  Mood and Affect: Mood normal.        Behavior: Behavior normal.        Thought Content: Thought content normal.        Judgment: Judgment normal.     Assessment & Plan:  Malignant neoplasm of upper-outer quadrant of left breast in female, estrogen receptor positive (HCC)  I spoke with Dr. Curvin.  The patient still has some imaging left to do.  We will plan to talk after that.  There is a possibility of needing to exchange out the implant.  There is also a possibility of needing to go smaller.  The patient would like to avoid that if at all possible.  Pictures were obtained of the patient and placed in the chart with the patient's or guardian's permission.   Estefana RAMAN Artasia Thang, DO

## 2024-05-06 ENCOUNTER — Encounter: Payer: Self-pay | Admitting: Rehabilitation

## 2024-05-06 ENCOUNTER — Other Ambulatory Visit: Payer: Self-pay | Admitting: Hematology

## 2024-05-06 ENCOUNTER — Ambulatory Visit: Admitting: Rehabilitation

## 2024-05-06 DIAGNOSIS — I972 Postmastectomy lymphedema syndrome: Secondary | ICD-10-CM | POA: Diagnosis not present

## 2024-05-06 DIAGNOSIS — M25612 Stiffness of left shoulder, not elsewhere classified: Secondary | ICD-10-CM

## 2024-05-06 DIAGNOSIS — R293 Abnormal posture: Secondary | ICD-10-CM

## 2024-05-06 NOTE — Therapy (Signed)
 OUTPATIENT PHYSICAL THERAPY  UPPER EXTREMITY ONCOLOGY EVALUATION  Patient Name: Amber Villarreal MRN: [id number] DOB:09-15-1975, 48 y.o., female Today's Date: 05/06/2024  END OF SESSION:  PT End of Session - 05/06/24 1356     Visit Number 2    Number of Visits 19    Date for Recertification  06/10/24    PT Start Time 1400    PT Stop Time 1457    PT Time Calculation (min) 57 min    Activity Tolerance Patient tolerated treatment well    Behavior During Therapy WFL for tasks assessed/performed          Past Medical History:  Diagnosis Date   Anemia    history of   Bipolar disorder, unspecified (HCC)    Breast cancer (HCC)    breast   Family history of prostate cancer    Hx of cardiovascular stress test    a. Lex MV 2/14: EF 50%, no ischemia, small L breast nodule   Opioid type dependence, unspecified    Past Surgical History:  Procedure Laterality Date   BREAST BIOPSY Left 04/10/2024   US  LT BREAST BX W LOC DEV 1ST LESION IMG BX SPEC US  GUIDE 04/10/2024 [facility]   BREAST BIOPSY Left 04/10/2024   US  LT BREAST BX W LOC DEV EA ADD LESION IMG BX SPEC US  GUIDE 04/10/2024 [facility]   DILATION AND CURETTAGE OF UTERUS     MASTECTOMY Bilateral 04/15/2018   Patient Active Problem List   Diagnosis Date Noted   Malignant neoplasm of upper-outer quadrant of left breast in female, estrogen receptor positive (HCC) 04/20/2024   Genetic testing 11/12/2017   Family history of prostate cancer    Ductal carcinoma in situ (DCIS) of left breast 10/10/2017   Chalazion of right upper eyelid 05/07/2017   Generalized muscle ache 05/07/2017   Raynaud's syndrome 05/07/2017   Contraceptive management 04/05/2016   Family history of malignant neoplasm of gastrointestinal tract 10/07/2015   Melanocytic nevi of right upper limb, including shoulder 10/07/2015   Alcohol abuse 09/04/2013   High risk sexual behavior 09/19/2012   Breast nodule 08/04/2012   Obesity 06/01/2011    BIPOLAR DISORDER UNSPECIFIED 06/10/2008    PCP: none  REFERRING PROVIDER: Curvin Deward MOULD, MD  REFERRING DIAG: Left Breast Cancer/Lymphedema  THERAPY DIAG:  Postmastectomy lymphedema  Stiffness of left shoulder, not elsewhere classified  Abnormal posture  ONSET DATE: 2021  Rationale for Evaluation and Treatment: Rehabilitation  SUBJECTIVE:  SUBJECTIVE STATEMENT:  Patient reports she is fine and does not have pain. She does report tightness and stiffness in the arm though.   PERTINENT HISTORY: Hx of bilateral mastectomies and left axillary lymph node dissection 04/15/2018. She recently felt a mass in the upper outer quadrant of the left reconstructed breast. She has a 2.2 cm recurrence IDC ER/PR+, HER2-, Ki67 3%.  PAIN:  Are you having pain? No  PRECAUTIONS: Other: LUE lymphedema  RED FLAGS: None   WEIGHT BEARING RESTRICTIONS: No  FALLS:  Has patient fallen in last 6 months? No  LIVING ENVIRONMENT: Lives with: lives with their family son 27, daughter 75 Lives in: House/apartment Has following equipment at home: None  OCCUPATION: disability  LEISURE: walks several times a week   HAND DOMINANCE: right   PRIOR LEVEL OF FUNCTION: Independent  PATIENT GOALS: to get the fluid out of my arm   OBJECTIVE: Note: Objective measures were completed at Evaluation unless otherwise noted.  COGNITION: Overall cognitive status: Within functional limits for tasks assessed   PALPATION: Fibrosis at L forearm  OBSERVATIONS / OTHER ASSESSMENTS: Fullness present throughout LUE and L lateral breast/trunk  POSTURE: forward head, rounded shoulders  UPPER EXTREMITY AROM/PROM:  A/PROM RIGHT   eval   Shoulder extension 75  Shoulder flexion 174  Shoulder abduction 179  Shoulder internal  rotation 62  Shoulder external rotation 90    (Blank rows = not tested)  A/PROM LEFT   eval  Shoulder extension 52  Shoulder flexion 171  Shoulder abduction 174  Shoulder internal rotation 48  Shoulder external rotation 90    (Blank rows = not tested)  CERVICAL AROM: All within normal limits:    Percent limited  Flexion WFL  Extension WFL  Right lateral flexion WFL  Left lateral flexion WFL  Right rotation WFL  Left rotation WFL    UPPER EXTREMITY STRENGTH:   LYMPHEDEMA ASSESSMENTS:   SURGERY TYPE/DATE: 01/2018 bilateral mastectomy and implant placement, 04/15/2018 - [facility]  NUMBER OF LYMPH NODES REMOVED: pt reports all nodes were removed during the [facility] in October 2019  CHEMOTHERAPY: none  RADIATION:none  HORMONE TREATMENT: was prescribed tamoxifen but pt reports she took it for a couple of months  INFECTIONS: none   LYMPHEDEMA ASSESSMENTS:   LANDMARK RIGHT  eval  At axilla  37.5  15 cm proximal to the proximal aspect of the olecranon process 34.4  10 cm proximal to the proximal aspect of the olecranon process 32.5  Olecranon process 26.5  15 cm proximal to the proximal aspect of the ulnar styloid process 26.5  10 cm proximal to the proximal aspect of the ulnar styloid process 22.8  Just distal to the ulnar styloid process 16.5  Across hand at thumb web space 20.5  At base of 2nd digit 6.5  (Blank rows = not tested)  LANDMARK LEFT  eval  At axilla  39  15 cm proximal to the proximal aspect of the olecranon process 37  10 cm proximal to the proximal aspect of the olecranon process 34.5  Olecranon process 31  15 cm proximal to the proximal aspect of the ulnar styloid process 31.5  10 cm proximal to  the proximal aspect of the ulnar styloid process 27.8  Just distal to the ulnar styloid process 20  Across hand at thumb web space 22.5  At base of 2nd digit 7  (Blank rows = not tested)  Chest circumference just inferior to the axillae: 108.5 cm Chest  circumference at the  largest point:  118 cm   QUICK DASH SURVEY:                                                                                                                                TREATMENT DATE:  Pt permission and consent throughout each step of examination and treatment with modification and draping if requested when working on sensitive areas.   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  Pt education on bandaging vs Velcro wrap and pt wanting to have us  place an order for a velcro wrap.   04/29/24- see education section    PATIENT EDUCATION:  Education details: lymphedema and how it is managed with MLD and compression, post op exercises, lymphedema risk reduction, ABC class, how to obtain a compression bra [place] educated: Patient Education method: Explanation and Handouts Education comprehension: verbalized understanding  HOME EXERCISE PROGRAM: Post op exercises  ASSESSMENT:  CLINICAL IMPRESSION: Patient tolerates the addition of Lt UE MLD with consent obtained throughout each step. Patient education is provided regarding the purpose of compression wrapping and the benefits of compression wrapping vs wearing the Velcro compression sleeve. The patient declines compression wrapping and a Velcro compression wrap is ordered instead. She reports that she would like to still come to her visit on Friday for  UE MLD. The patient would benefit from continued skilled physical therapy for DME instructions and MLD to decrease lymphedema.   OBJECTIVE IMPAIRMENTS: decreased knowledge of condition, decreased knowledge of use of DME, decreased ROM, increased edema, and postural dysfunction.   ACTIVITY LIMITATIONS: none  PARTICIPATION LIMITATIONS: none  PERSONAL FACTORS: Time since onset of injury/illness/exacerbation are also affecting patient's functional outcome.   REHAB POTENTIAL: Good  CLINICAL DECISION MAKING: Evolving/moderate complexity  EVALUATION COMPLEXITY: Moderate  GOALS: Goals reviewed with patient? Yes  SHORT TERM GOALS: Target date: 05/20/24  Pt will be independent in donning compression bandages for management of lymphedema. Baseline: Goal status: INITIAL  2.  Pt will demonstrate a 1 cm reduction in lymphedema at hand to decrease risk of infection. Baseline:  Goal status: INITIAL  3.  Pt will be independent in self MLD for long term management of lymphedema.  Baseline:  Goal status: INITIAL  LONG TERM GOALS: Target date: 06/10/24  Pt will obtain appropriate compression garments for day and night time for long term management of lymphedema. Baseline:  Goal status: INITIAL  2. Pt will demo she has regained full shoulder ROM and function post operatively compared to baselines.  Baseline:  Goal status: INITIAL  3.  Pt will be able to independently manage her lymphedema through use of self MLD and compression garments to decrease risk of lymphedema.  Baseline:  Goal status: INITIAL  4.  Pt will demonstrate a 2 cm decrease in edema just distal to ulnar styloid process to decrease risk of infection.  Baseline:  Goal status: INITIAL   PLAN:  PT FREQUENCY: 3x/week  PT DURATION: 6 weeks  PLANNED INTERVENTIONS: 97164- PT Re-evaluation, 97110-Therapeutic exercises, 97530- Therapeutic activity, 97112- Neuromuscular re-education, 97535- Self Care, [id number]- Manual therapy,  [phone]- Orthotic Initial, [phone]- Orthotic/Prosthetic subsequent, Patient/Family education, Balance training, Joint mobilization, Therapeutic exercises, Therapeutic activity, Neuromuscular re-education, Gait training, and Self Care  PLAN FOR NEXT SESSION: begin CDT to LUE and ask if pt received Velcro compression wrap   Randall Pack, SPT  05/06/2024, 2:57 PM  I agree with the following treatment note after reviewing documentation. This session was performed under the supervision of a licensed clinician.  Saddie Raw, PT 05/06/24, 3:58 PM

## 2024-05-07 ENCOUNTER — Encounter: Payer: Self-pay | Admitting: General Practice

## 2024-05-07 ENCOUNTER — Ambulatory Visit: Payer: Self-pay | Admitting: General Surgery

## 2024-05-07 NOTE — Progress Notes (Signed)
 SPIRITUAL CARE AND COUNSELING CONSULT NOTE   VISIT SUMMARY Followed up with Ms Laine by phone. She reports deep gratitude for the support of her whole care team. Though she reports no other needs at this time, she knows to reach out whenever needed/desired for follow-up support.   SPIRITUAL ENCOUNTER                                                                                                                                                                      Type of Visit: Follow up Care provided to:: Patient Referral source: Chaplain assessment Reason for visit: Routine spiritual support  SPIRITUAL FRAMEWORK  Presenting Themes: Courage hope and growth, Coping tools Values/beliefs: Confidence in team and care plan Strengths: Feeling energized and relieved about moving forward with treatment plan Patient Stress Factors: Loss of control   GOALS   Clinical Care Goals: Chaplain remains available in case future needs arise   INTERVENTIONS   Spiritual Care Interventions Made: Compassionate presence, Reflective listening, Normalization of emotions   INTERVENTION OUTCOMES   Outcomes: Awareness of support  SPIRITUAL CARE PLAN   Spiritual Care Issues Still Outstanding: No further spiritual care needs at this time (see row info) Follow up plan : Patient knows to contact chaplain as needed/desired    Elia Olam Corrigan, MDiv, [facility] Pager [phone] Voicemail [phone]

## 2024-05-07 NOTE — Progress Notes (Signed)
 [facility] Spiritual Care Note  Reached Amber Villarreal by phone at inconvenient time for [facility] follow-up call. We plan to speak another time today or this week, as schedules allow.  [address] Olam Corrigan, [place], [facility] Pager [phone] Voicemail [phone]

## 2024-05-08 ENCOUNTER — Ambulatory Visit: Admitting: Rehabilitation

## 2024-05-08 ENCOUNTER — Ambulatory Visit (INDEPENDENT_AMBULATORY_CARE_PROVIDER_SITE_OTHER): Admitting: Plastic Surgery

## 2024-05-08 DIAGNOSIS — Z170 Estrogen receptor positive status [ER+]: Secondary | ICD-10-CM

## 2024-05-08 DIAGNOSIS — C50412 Malignant neoplasm of upper-outer quadrant of left female breast: Secondary | ICD-10-CM

## 2024-05-08 NOTE — Progress Notes (Signed)
   Subjective:    Patient ID: Amber Villarreal, female    DOB: 03/27/1976, 48 y.o.   MRN: [id number]  The patient is a 48 year old female joining me by phone.  As far as I know she is at home and I am at the office.  She was seen a few days ago here in the office.  In 2019 she had bilateral mastectomies at [facility] with reconstruction.  She has 800 cc gel implants in place and she has had 2 or 3 attempts at fat grafting.  She read me the card today and she is going to try and upload it into her chart.  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  It is estrogen progesterone positive and HER2 negative.  She is seeing Dr. Curvin.  She needs a reexcision.  She may need the capsule removed as well.  It is advised that she has the capsule removed by Dr. Curvin.  But the patient is not willing to go with a smaller implant.  I cannot guarantee that I can get an 800 cc implant in place.  The patient is adamant about not going smaller.      Review of Systems     Objective:   Physical Exam      Assessment & Plan:     ICD-10-CM   1. Malignant neoplasm of upper-outer quadrant of left breast in female, estrogen receptor positive (HCC)  C50.412    Z17.0       I connected with  Via Credeur on 05/08/24 by phone application and verified that I am speaking with the correct Amber Villarreal using two identifiers.  We spent about 5 minutes in discussion.   I have spoken with Dr. Curvin and expressed what was happening.  I will have the staff reach out to the patient for an in office visit for next week to discuss this further.  It is an option to do the excision without removal of the capsule.  However if the capsule turns out to be positive then she will have to have another surgery.  Dr. Curvin is going to talk to the patient next week.   I discussed the limitations of evaluation and management by telemedicine. The patient expressed understanding and agreed to  proceed.  SCX [employer]lle [employer] Cohesive Breast Implant  800 cc REF [id number]

## 2024-05-11 ENCOUNTER — Ambulatory Visit

## 2024-05-11 ENCOUNTER — Encounter: Payer: Self-pay | Admitting: *Deleted

## 2024-05-11 DIAGNOSIS — M25612 Stiffness of left shoulder, not elsewhere classified: Secondary | ICD-10-CM

## 2024-05-11 DIAGNOSIS — I972 Postmastectomy lymphedema syndrome: Secondary | ICD-10-CM | POA: Diagnosis not present

## 2024-05-11 DIAGNOSIS — R293 Abnormal posture: Secondary | ICD-10-CM

## 2024-05-11 NOTE — Therapy (Signed)
 OUTPATIENT PHYSICAL THERAPY  UPPER EXTREMITY ONCOLOGY TREATMENT  Patient Name: Amber Villarreal MRN: [id number] DOB:03/14/1976, 48 y.o., female Today's Date: 05/11/2024  END OF SESSION:  PT End of Session - 05/11/24 1207     Visit Number 3    Number of Visits 19    Date for Recertification  06/10/24    PT Start Time 1200    PT Stop Time 1304    PT Time Calculation (min) 64 min    Activity Tolerance Patient tolerated treatment well    Behavior During Therapy WFL for tasks assessed/performed          Past Medical History:  Diagnosis Date   Anemia    history of   Bipolar disorder, unspecified (HCC)    Breast cancer (HCC)    breast   Family history of prostate cancer    Hx of cardiovascular stress test    a. Lex MV 2/14: EF 50%, no ischemia, small L breast nodule   Opioid type dependence, unspecified    Past Surgical History:  Procedure Laterality Date   BREAST BIOPSY Left 04/10/2024   US  LT BREAST BX W LOC DEV 1ST LESION IMG BX SPEC US  GUIDE 04/10/2024 [facility]   BREAST BIOPSY Left 04/10/2024   US  LT BREAST BX W LOC DEV EA ADD LESION IMG BX SPEC US  GUIDE 04/10/2024 [facility]   DILATION AND CURETTAGE OF UTERUS     MASTECTOMY Bilateral 04/15/2018   Patient Active Problem List   Diagnosis Date Noted   Malignant neoplasm of upper-outer quadrant of left breast in female, estrogen receptor positive (HCC) 04/20/2024   Genetic testing 11/12/2017   Family history of prostate cancer    Ductal carcinoma in situ (DCIS) of left breast 10/10/2017   Chalazion of right upper eyelid 05/07/2017   Generalized muscle ache 05/07/2017   Raynaud's syndrome 05/07/2017   Contraceptive management 04/05/2016   Family history of malignant neoplasm of gastrointestinal tract 10/07/2015   Melanocytic nevi of right upper limb, including shoulder 10/07/2015   Alcohol abuse 09/04/2013   High risk sexual behavior 09/19/2012   Breast nodule 08/04/2012   Obesity 06/01/2011    BIPOLAR DISORDER UNSPECIFIED 06/10/2008    PCP: none  REFERRING PROVIDER: Curvin Deward MOULD, MD  REFERRING DIAG: Left Breast Cancer/Lymphedema  THERAPY DIAG:  Postmastectomy lymphedema  Stiffness of left shoulder, not elsewhere classified  Abnormal posture  ONSET DATE: 2021  Rationale for Evaluation and Treatment: Rehabilitation  SUBJECTIVE:  SUBJECTIVE STATEMENT:  Pt reports hand piece arrived Saturday but they had the address wrong so she reached out to [facility] and they said they corrected her address for future shipments. The arm piece is on backorder so will arrive later.   PERTINENT HISTORY: Hx of bilateral mastectomies and left axillary lymph node dissection 04/15/2018. She recently felt a mass in the upper outer quadrant of the left reconstructed breast. She has a 2.2 cm recurrence IDC ER/PR+, HER2-, Ki67 3%.  PAIN:  Are you having pain? No  PRECAUTIONS: Other: LUE lymphedema  RED FLAGS: None   WEIGHT BEARING RESTRICTIONS: No  FALLS:  Has patient fallen in last 6 months? No  LIVING ENVIRONMENT: Lives with: lives with their family son 32, daughter 2 Lives in: House/apartment Has following equipment at home: None  OCCUPATION: disability  LEISURE: walks several times a week   HAND DOMINANCE: right   PRIOR LEVEL OF FUNCTION: Independent  PATIENT GOALS: to get the fluid out of my arm   OBJECTIVE: Note: Objective measures were completed at Evaluation unless otherwise noted.  COGNITION: Overall cognitive status: Within functional limits for tasks assessed   PALPATION: Fibrosis at L forearm  OBSERVATIONS / OTHER ASSESSMENTS: Fullness present throughout LUE and L lateral breast/trunk  POSTURE: forward head, rounded shoulders  UPPER EXTREMITY AROM/PROM:  A/PROM RIGHT    eval   Shoulder extension 75  Shoulder flexion 174  Shoulder abduction 179  Shoulder internal rotation 62  Shoulder external rotation 90    (Blank rows = not tested)  A/PROM LEFT   eval  Shoulder extension 52  Shoulder flexion 171  Shoulder abduction 174  Shoulder internal rotation 48  Shoulder external rotation 90    (Blank rows = not tested)  CERVICAL AROM: All within normal limits:    Percent limited  Flexion WFL  Extension WFL  Right lateral flexion WFL  Left lateral flexion WFL  Right rotation WFL  Left rotation WFL    UPPER EXTREMITY STRENGTH:   LYMPHEDEMA ASSESSMENTS:   SURGERY TYPE/DATE: 01/2018 bilateral mastectomy and implant placement, 04/15/2018 - ALND  NUMBER OF LYMPH NODES REMOVED: pt reports all nodes were removed during the ALND in October 2019  CHEMOTHERAPY: none  RADIATION:none  HORMONE TREATMENT: was prescribed tamoxifen but pt reports she took it for a couple of months  INFECTIONS: none   LYMPHEDEMA ASSESSMENTS:   LANDMARK RIGHT  eval  At axilla  37.5  15 cm proximal to the proximal aspect of the olecranon process 34.4  10 cm proximal to the proximal aspect of the olecranon process 32.5  Olecranon process 26.5  15 cm proximal to the proximal aspect of the ulnar styloid process 26.5  10 cm proximal to the proximal aspect of the ulnar styloid process 22.8  Just distal to the ulnar styloid process 16.5  Across hand at thumb web space 20.5  At base of 2nd digit 6.5  (Blank rows = not tested)  LANDMARK LEFT  eval  At axilla  39  15 cm proximal to the proximal aspect of the olecranon process 37  10 cm proximal to the proximal aspect of the olecranon process 34.5  Olecranon process 31  15 cm proximal to the proximal aspect of the ulnar styloid process 31.5  10 cm proximal to  the proximal aspect of the ulnar styloid process 27.8  Just distal to the ulnar styloid process 20  Across hand at thumb web space 22.5  At base of 2nd digit  7  (Blank  rows = not tested)  Chest circumference just inferior to the axillae: 108.5 cm Chest circumference at the largest point:  118 cm   QUICK DASH SURVEY:                                                                                                                                TREATMENT DATE:  Pt permission and consent throughout each step of examination and treatment with modification and draping if requested when working on sensitive areas.   05/11/24: Self Care Spent time educating pt in basics of anatomy of lymphatic system and principles of MLD before instructing her in same while performing. Pt wanted to try Power Plate before and after session for increased lymphatic flow: Stood on Power Plate 1 x 30 sec before and after session   Manual Therapy MLD to Lt UE as follows: In supine: Short neck, 5 diaphragmatic breaths (spent time educating pt I proper diaphragmatic breathing technique), Rt axillary nodes and establishment of anterior inter-axillary pathway, Lt inguinal nodes and establishment of Lt axillo-inguinal pathway, then Lt UE working proximal to distal: lateral upper arm, inner to outer, and lateral again, both sides of forearm, and dorsal hand/wrist moving fluid towards pathways spending extra time in any areas of fibrosis then retracing all steps. Began instructing pt in this and having her return demo of most steps and using VC's and hand over hand cuing for reinforcement of correct pressure and skin stretch.  P/ROM to Lt shoulder briefly during MLD for end ROM stretches into flex, abd and D2 with scapular depression throughout by therapist  05/06/24 In supine: Short neck, superficial and deep abdominals, Rt axillary nodes and establishment of interaxillary pathway, L inguinal nodes and establishment of axilloinguinal pathway, then L UE working proximal to distal, moving fluid from upper inner arm outwards, and doing both sides of forearm moving fluid towards  pathways spending extra time in any areas of fibrosis then retracing all steps   Started to get bandaging supplies out and started to discuss how the bandaging works and pt decided that she would not be able to do the bandaging due to hygiene reasons and 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  Pt education on bandaging vs Velcro wrap and pt wanting to have us  place an order for a velcro wrap.   04/29/24- see education section    PATIENT EDUCATION:  Education details: Self MLD [place] educated: Patient Education method: Explanation and Handouts, demonstration, tactile and VC's Education comprehension: verbalized understanding, returned demo, tactile and VC's and will benefit from further review  HOME EXERCISE PROGRAM: Post op exercises  ASSESSMENT:  CLINICAL IMPRESSION: Pt reports her velcro hand piece arrived Saturday but sleeve is on backorder. Today continued with Lt UE MLD and began instructing pt in same. Had her return demo and offered feedback for reinforcement of proper technique. Pt will beenfit from further review of this and instruction on how to don  her velcro compression garment once this arrives.   OBJECTIVE IMPAIRMENTS: decreased knowledge of condition, decreased knowledge of use of DME, decreased ROM, increased edema, and postural dysfunction.   ACTIVITY LIMITATIONS: none  PARTICIPATION LIMITATIONS: none  PERSONAL FACTORS: Time since onset of injury/illness/exacerbation are also affecting patient's functional outcome.   REHAB POTENTIAL: Good  CLINICAL DECISION MAKING: Evolving/moderate complexity  EVALUATION COMPLEXITY: Moderate  GOALS: Goals reviewed with patient? Yes  SHORT TERM GOALS: Target date: 05/20/24  Pt will be independent in donning compression bandages for management of lymphedema. Baseline: Goal status: INITIAL  2.  Pt will demonstrate a 1 cm reduction in lymphedema at hand to decrease risk of infection. Baseline:  Goal status:  INITIAL  3.  Pt will be independent in self MLD for long term management of lymphedema.  Baseline:  Goal status: INITIAL  LONG TERM GOALS: Target date: 06/10/24  Pt will obtain appropriate compression garments for day and night time for long term management of lymphedema. Baseline:  Goal status: INITIAL  2. Pt will demo she has regained full shoulder ROM and function post operatively compared to baselines.  Baseline:  Goal status: INITIAL  3.  Pt will be able to independently manage her lymphedema through use of self MLD and compression garments to decrease risk of lymphedema.  Baseline:  Goal status: INITIAL  4.  Pt will demonstrate a 2 cm decrease in edema just distal to ulnar styloid process to decrease risk of infection.  Baseline:  Goal status: INITIAL   PLAN:  PT FREQUENCY: 3x/week  PT DURATION: 6 weeks  PLANNED INTERVENTIONS: 97164- PT Re-evaluation, 97110-Therapeutic exercises, 97530- Therapeutic activity, 97112- Neuromuscular re-education, 97535- Self Care, 02859- Manual therapy, [phone]- Orthotic Initial, [phone]- Orthotic/Prosthetic subsequent, Patient/Family education, Balance training, Joint mobilization, Therapeutic exercises, Therapeutic activity, Neuromuscular re-education, Gait training, and Self Care  PLAN FOR NEXT SESSION: Cont MLD to Lt UE and review prn; instruct in proper donning of velcro garment once both pieces arrive (she has hand piece)    Berwyn Knights, PTA 05/11/24 1:16 PM   [facility] [phone]  Start with circles near neck placing hands behind collarbones and circling towards neck, 5-10 times.   Deep Effective Breath   Standing, sitting, or laying down, place both hands on the belly. Take a deep breath IN, expanding the belly; then breath OUT, contracting the belly. Repeat __5__ times. Do __2-3__ sessions per day and before your self massage.  Axilla to Axilla - Sweep   On uninvolved side make 5 circles in the armpit, then pump  _5__ times from involved armpit across chest to uninvolved armpit, making a pathway. Do _1__ time per day.  Axilla to Inguinal Nodes - Sweep   On involved side, make 5 circles at groin at panty line, then pump _5__ times from armpit along side of trunk to outer hip, making your other pathway. Do __1_ time per day.  Arm Posterior: Elbow to Shoulder - Sweep   Pump _5__ times from back of elbow to top of shoulder. Then inner to outer upper arm _5_ times, then outer arm again _5_ times. Then back to the pathways _2-3_ times. Do _1__ time per day.   ARM: Volar Wrist to Elbow - Sweep   Pump or stationary circles _5__ times from wrist to elbow making sure to do both sides of the forearm. Then retrace your steps to the outer arm, and the pathways _2-3_ times each. Do _1__ time per day.  Copyright  VHI. All rights reserved.  ARM: Dorsum of Hand to Shoulder - Sweep   Pump or stationary circles _5__ times on back of hand including knuckle spaces and individual fingers if needed working up towards the wrist, then retrace all your steps working back up the forearm, doing both sides; upper outer arm and back to your pathways _2-3_ times each. Then do 5 circles again at uninvolved armpit and involved groin where you started! Good job!! Do __1_ time per day.

## 2024-05-11 NOTE — Patient Instructions (Addendum)
 Amber Villarreal

## 2024-05-12 ENCOUNTER — Ambulatory Visit: Admitting: Plastic Surgery

## 2024-05-12 VITALS — BP 152/109 | HR 95

## 2024-05-12 DIAGNOSIS — D0512 Intraductal carcinoma in situ of left breast: Secondary | ICD-10-CM

## 2024-05-12 DIAGNOSIS — C50912 Malignant neoplasm of unspecified site of left female breast: Secondary | ICD-10-CM

## 2024-05-12 NOTE — Progress Notes (Signed)
 The patient is a 48 year old female here for further discussion about breast reconstruction.  The patient has a breast cancer of the left breast.  She is already had reconstruction and has recurrence.  We had a full discussion about the possibility of not being able to put another 800 cc implant in the space if the capsule is removed.  She is aware Dr. Curvin will be giving her a call this week for further discussion about her options.  She knows to give me a call after so she can let me know what she wants to do.

## 2024-05-14 ENCOUNTER — Ambulatory Visit: Payer: Self-pay | Admitting: General Surgery

## 2024-05-14 DIAGNOSIS — C50412 Malignant neoplasm of upper-outer quadrant of left female breast: Secondary | ICD-10-CM

## 2024-05-15 ENCOUNTER — Ambulatory Visit

## 2024-05-19 ENCOUNTER — Encounter: Payer: Self-pay | Admitting: *Deleted

## 2024-05-19 ENCOUNTER — Other Ambulatory Visit: Payer: Self-pay | Admitting: General Surgery

## 2024-05-19 DIAGNOSIS — C50412 Malignant neoplasm of upper-outer quadrant of left female breast: Secondary | ICD-10-CM

## 2024-05-20 ENCOUNTER — Telehealth: Payer: Self-pay | Admitting: Hematology

## 2024-05-20 NOTE — Telephone Encounter (Signed)
 LVM  regarding pt appt

## 2024-06-03 ENCOUNTER — Encounter (HOSPITAL_BASED_OUTPATIENT_CLINIC_OR_DEPARTMENT_OTHER): Payer: Self-pay | Admitting: General Surgery

## 2024-06-09 ENCOUNTER — Inpatient Hospital Stay
Admission: RE | Admit: 2024-06-09 | Discharge: 2024-06-09 | Disposition: A | Source: Ambulatory Visit | Attending: General Surgery | Admitting: General Surgery

## 2024-06-09 DIAGNOSIS — C50412 Malignant neoplasm of upper-outer quadrant of left female breast: Secondary | ICD-10-CM

## 2024-06-09 HISTORY — PX: BREAST BIOPSY: SHX20

## 2024-06-10 ENCOUNTER — Ambulatory Visit (HOSPITAL_BASED_OUTPATIENT_CLINIC_OR_DEPARTMENT_OTHER): Admitting: Anesthesiology

## 2024-06-10 ENCOUNTER — Ambulatory Visit (HOSPITAL_BASED_OUTPATIENT_CLINIC_OR_DEPARTMENT_OTHER)
Admission: RE | Admit: 2024-06-10 | Discharge: 2024-06-10 | Disposition: A | Attending: General Surgery | Admitting: General Surgery

## 2024-06-10 ENCOUNTER — Encounter (HOSPITAL_BASED_OUTPATIENT_CLINIC_OR_DEPARTMENT_OTHER): Payer: Self-pay | Admitting: General Surgery

## 2024-06-10 ENCOUNTER — Encounter

## 2024-06-10 ENCOUNTER — Encounter (HOSPITAL_BASED_OUTPATIENT_CLINIC_OR_DEPARTMENT_OTHER): Admission: RE | Disposition: A | Payer: Self-pay | Attending: General Surgery

## 2024-06-10 ENCOUNTER — Inpatient Hospital Stay: Admission: RE | Admit: 2024-06-10 | Discharge: 2024-06-10 | Attending: General Surgery | Admitting: General Surgery

## 2024-06-10 ENCOUNTER — Other Ambulatory Visit: Payer: Self-pay

## 2024-06-10 DIAGNOSIS — Z87891 Personal history of nicotine dependence: Secondary | ICD-10-CM | POA: Insufficient documentation

## 2024-06-10 DIAGNOSIS — Z1732 Human epidermal growth factor receptor 2 negative status: Secondary | ICD-10-CM | POA: Diagnosis not present

## 2024-06-10 DIAGNOSIS — Z170 Estrogen receptor positive status [ER+]: Secondary | ICD-10-CM | POA: Insufficient documentation

## 2024-06-10 DIAGNOSIS — C50412 Malignant neoplasm of upper-outer quadrant of left female breast: Secondary | ICD-10-CM | POA: Insufficient documentation

## 2024-06-10 DIAGNOSIS — Z1721 Progesterone receptor positive status: Secondary | ICD-10-CM | POA: Insufficient documentation

## 2024-06-10 DIAGNOSIS — Z9013 Acquired absence of bilateral breasts and nipples: Secondary | ICD-10-CM | POA: Diagnosis not present

## 2024-06-10 DIAGNOSIS — C50912 Malignant neoplasm of unspecified site of left female breast: Secondary | ICD-10-CM

## 2024-06-10 HISTORY — PX: RADIOACTIVE SEED GUIDED BREAST BIOPSY: SHX7688

## 2024-06-10 LAB — POCT PREGNANCY, URINE: Preg Test, Ur: NEGATIVE

## 2024-06-10 SURGERY — RADIOACTIVE SEED GUIDED BREAST BIOPSY
Anesthesia: General | Site: Breast | Laterality: Left

## 2024-06-10 MED ORDER — MIDAZOLAM HCL 2 MG/2ML IJ SOLN
INTRAMUSCULAR | Status: AC
  Filled 2024-06-10: qty 2

## 2024-06-10 MED ORDER — PROPOFOL 10 MG/ML IV BOLUS
INTRAVENOUS | Status: DC | PRN
  Administered 2024-06-10: 09:00:00 170 mg via INTRAVENOUS

## 2024-06-10 MED ORDER — GABAPENTIN 100 MG PO CAPS
100.0000 mg | ORAL_CAPSULE | ORAL | Status: AC
  Administered 2024-06-10: 07:00:00 100 mg via ORAL

## 2024-06-10 MED ORDER — PROPOFOL 10 MG/ML IV BOLUS
INTRAVENOUS | Status: AC
  Filled 2024-06-10: qty 20

## 2024-06-10 MED ORDER — BUPIVACAINE-EPINEPHRINE (PF) 0.25% -1:200000 IJ SOLN
INTRAMUSCULAR | Status: DC | PRN
  Administered 2024-06-10: 09:00:00 17 mL

## 2024-06-10 MED ORDER — LIDOCAINE 2% (20 MG/ML) 5 ML SYRINGE
INTRAMUSCULAR | Status: AC
  Filled 2024-06-10: qty 5

## 2024-06-10 MED ORDER — BUPIVACAINE-EPINEPHRINE (PF) 0.25% -1:200000 IJ SOLN
INTRAMUSCULAR | Status: AC
  Filled 2024-06-10: qty 30

## 2024-06-10 MED ORDER — ONDANSETRON HCL 4 MG/2ML IJ SOLN
INTRAMUSCULAR | Status: DC | PRN
  Administered 2024-06-10: 09:00:00 4 mg via INTRAVENOUS

## 2024-06-10 MED ORDER — ONDANSETRON HCL 4 MG/2ML IJ SOLN
INTRAMUSCULAR | Status: AC
  Filled 2024-06-10: qty 2

## 2024-06-10 MED ORDER — GABAPENTIN 100 MG PO CAPS
ORAL_CAPSULE | ORAL | Status: AC
  Filled 2024-06-10: qty 1

## 2024-06-10 MED ORDER — FENTANYL CITRATE (PF) 100 MCG/2ML IJ SOLN
INTRAMUSCULAR | Status: AC
  Filled 2024-06-10: qty 2

## 2024-06-10 MED ORDER — PROPOFOL 500 MG/50ML IV EMUL
INTRAVENOUS | Status: DC | PRN
  Administered 2024-06-10: 09:00:00 25 ug/kg/min via INTRAVENOUS

## 2024-06-10 MED ORDER — TRAMADOL HCL 50 MG PO TABS: 50.0000 mg | ORAL_TABLET | Freq: Four times a day (QID) | ORAL | 0 refills | Status: AC | PRN

## 2024-06-10 MED ORDER — ACETAMINOPHEN 500 MG PO TABS
1000.0000 mg | ORAL_TABLET | ORAL | Status: AC
  Administered 2024-06-10: 07:00:00 1000 mg via ORAL

## 2024-06-10 MED ORDER — CEFAZOLIN SODIUM-DEXTROSE 2-4 GM/100ML-% IV SOLN
INTRAVENOUS | Status: AC
  Filled 2024-06-10: qty 100

## 2024-06-10 MED ORDER — CHLORHEXIDINE GLUCONATE CLOTH 2 % EX PADS: 6.0000 | MEDICATED_PAD | Freq: Once | CUTANEOUS | Status: DC

## 2024-06-10 MED ORDER — FENTANYL CITRATE (PF) 100 MCG/2ML IJ SOLN
INTRAMUSCULAR | Status: DC | PRN
  Administered 2024-06-10: 09:00:00 25 ug via INTRAVENOUS
  Administered 2024-06-10: 09:00:00 50 ug via INTRAVENOUS
  Administered 2024-06-10 (×2): 12.5 ug via INTRAVENOUS

## 2024-06-10 MED ORDER — LACTATED RINGERS IV SOLN: INTRAVENOUS | Status: DC

## 2024-06-10 MED ORDER — MIDAZOLAM HCL 5 MG/5ML IJ SOLN
INTRAMUSCULAR | Status: DC | PRN
  Administered 2024-06-10 (×2): 1 mg via INTRAVENOUS

## 2024-06-10 MED ORDER — CEFAZOLIN SODIUM-DEXTROSE 2-4 GM/100ML-% IV SOLN: 2.0000 g | INTRAVENOUS | Status: DC

## 2024-06-10 MED ORDER — ACETAMINOPHEN 500 MG PO TABS
ORAL_TABLET | ORAL | Status: AC
  Filled 2024-06-10: qty 2

## 2024-06-10 MED ORDER — CEFAZOLIN SODIUM-DEXTROSE 2-4 GM/100ML-% IV SOLN
2.0000 g | INTRAVENOUS | Status: AC
  Administered 2024-06-10: 09:00:00 2 g via INTRAVENOUS

## 2024-06-10 MED ORDER — LIDOCAINE HCL (CARDIAC) PF 100 MG/5ML IV SOSY
PREFILLED_SYRINGE | INTRAVENOUS | Status: DC | PRN
  Administered 2024-06-10: 09:00:00 50 mg via INTRAVENOUS

## 2024-06-10 MED ORDER — HYDROMORPHONE HCL 1 MG/ML IJ SOLN: 0.2500 mg | INTRAMUSCULAR | Status: DC | PRN

## 2024-06-10 MED ORDER — PHENYLEPHRINE HCL (PRESSORS) 10 MG/ML IV SOLN
INTRAVENOUS | Status: DC | PRN
  Administered 2024-06-10: 09:00:00 80 ug via INTRAVENOUS
  Administered 2024-06-10 (×2): 40 ug via INTRAVENOUS

## 2024-06-10 MED FILL — Propofol IV Emul 500 MG/50ML (10 MG/ML): INTRAVENOUS | Qty: 50 | Status: AC

## 2024-06-10 MED FILL — Phenylephrine-NaCl Pref Syr 0.8 MG/10ML-0.9% (80 MCG/ML): INTRAVENOUS | Qty: 10 | Status: AC

## 2024-06-10 SURGICAL SUPPLY — 34 items
BINDER BREAST XXLRG (GAUZE/BANDAGES/DRESSINGS) IMPLANT
BLADE SURG 15 STRL LF DISP TIS (BLADE) ×1 IMPLANT
CANISTER SUC SOCK COL 7IN (MISCELLANEOUS) ×1 IMPLANT
CANISTER SUCT 1200ML W/VALVE (MISCELLANEOUS) ×1 IMPLANT
CHLORAPREP W/TINT 26 (MISCELLANEOUS) ×1 IMPLANT
CLIP APPLIE 9.375 MED OPEN (MISCELLANEOUS) IMPLANT
COVER BACK TABLE 60X90IN (DRAPES) ×1 IMPLANT
COVER MAYO STAND STRL (DRAPES) ×1 IMPLANT
COVER PROBE W GEL 5X96 (DRAPES) ×1 IMPLANT
DERMABOND ADVANCED .7 DNX12 (GAUZE/BANDAGES/DRESSINGS) ×1 IMPLANT
DRAPE LAPAROSCOPIC ABDOMINAL (DRAPES) ×1 IMPLANT
DRAPE UTILITY XL STRL (DRAPES) ×1 IMPLANT
ELECT COATED BLADE 2.86 ST (ELECTRODE) ×1 IMPLANT
ELECTRODE REM PT RTRN 9FT ADLT (ELECTROSURGICAL) ×1 IMPLANT
GLOVE SURG SYN 7.0 PF PI (GLOVE) IMPLANT
GLOVE SURG SYN 7.5 PF PI (GLOVE) IMPLANT
GOWN STRL REUS W/ TWL LRG LVL3 (GOWN DISPOSABLE) ×2 IMPLANT
KIT MARKER MARGIN INK (KITS) ×1 IMPLANT
NDL HYPO 25X1 1.5 SAFETY (NEEDLE) IMPLANT
NEEDLE HYPO 25X1 1.5 SAFETY (NEEDLE) IMPLANT
PACK BASIN DAY SURGERY FS (CUSTOM PROCEDURE TRAY) ×1 IMPLANT
PENCIL SMOKE EVACUATOR (MISCELLANEOUS) ×1 IMPLANT
SLEEVE SCD COMPRESS KNEE MED (STOCKING) ×1 IMPLANT
SOLN 0.9% NACL POUR BTL 1000ML (IV SOLUTION) IMPLANT
SPIKE FLUID TRANSFER (MISCELLANEOUS) IMPLANT
SPONGE T-LAP 18X18 ~~LOC~~+RFID (SPONGE) ×1 IMPLANT
SUT MON AB 4-0 PC3 18 (SUTURE) ×1 IMPLANT
SUT SILK 2 0 SH (SUTURE) IMPLANT
SUT VICRYL 3-0 CR8 SH (SUTURE) ×1 IMPLANT
SYR CONTROL 10ML LL (SYRINGE) IMPLANT
TOWEL GREEN STERILE FF (TOWEL DISPOSABLE) ×1 IMPLANT
TRAY FAXITRON CT DISP (TRAY / TRAY PROCEDURE) ×1 IMPLANT
TUBE CONNECTING 20X1/4 (TUBING) ×1 IMPLANT
YANKAUER SUCT BULB TIP NO VENT (SUCTIONS) IMPLANT

## 2024-06-10 NOTE — Interval H&P Note (Signed)
 History and Physical Interval Note:  06/10/2024 7:18 AM  Amber Villarreal  has presented today for surgery, with the diagnosis of RECURRENT LEFT BREAST CANCER.  The various methods of treatment have been discussed with the patient and family. After consideration of risks, benefits and other options for treatment, the patient has consented to  Procedures with comments: RADIOACTIVE SEED GUIDED BREAST BIOPSY (Left) - EXCISION LEFT BREAST RECURRENT CANCER WITH RADIOACTIVE SEED LOCALIZATION as a surgical intervention.  The patient's history has been reviewed, patient examined, no change in status, stable for surgery.  I have reviewed the patient's chart and labs.  Questions were answered to the patient's satisfaction.     Deward Null III

## 2024-06-10 NOTE — Discharge Instructions (Signed)
°  Post Anesthesia Home Care Instructions  Activity: Get plenty of rest for the remainder of the day. A responsible individual must stay with you for 24 hours following the procedure.  For the next 24 hours, DO NOT: -Drive a car -Advertising copywriter -Drink alcoholic beverages -Take any medication unless instructed by your physician -Make any legal decisions or sign important papers.  Meals: Start with liquid foods such as gelatin or soup. Progress to regular foods as tolerated. Avoid greasy, spicy, heavy foods. If nausea and/or vomiting occur, drink only clear liquids until the nausea and/or vomiting subsides. Call your physician if vomiting continues.  Special Instructions/Symptoms: Your throat may feel dry or sore from the anesthesia or the breathing tube placed in your throat during surgery. If this causes discomfort, gargle with warm salt water. The discomfort should disappear within 24 hours.  Next dose of Tylenol  can be taken today at 130pm.

## 2024-06-10 NOTE — H&P (Signed)
 REFERRING PHYSICIAN: Lanny Callander, MD PROVIDER: DEWARD GARNETTE NULL, MD MRN: [id number] DOB: 03/22/1976 Subjective   Chief Complaint: Breast Cancer  History of Present Illness: Amber Villarreal is a 48 y.o. female who is seen today as an office consultation for evaluation of Breast Cancer  We are asked to see the patient in consultation by Dr. Lanny to evaluate her for a new left breast cancer. The patient is a 48 year old black female who has a history of bilateral mastectomies and left axillary lymph node dissection back in 2019. She recently felt a mass in the upper outer quadrant of the left reconstructed breast. This mass measured 2.2 cm by ultrasound. It was biopsied and came back as an invasive ductal type of breast cancer that was ER and PR positive and HER2 negative with a Ki-67 of 3%. The axilla did not show any abnormal lymph nodes. She does not smoke. She does have some lymphedema in the left arm. She is very upset because some of the information that she remembers from 2019 does not seem to match the old records that we have been able to find.  Review of Systems: A complete review of systems was obtained from the patient. I have reviewed this information and discussed as appropriate with the patient. See HPI as well for other ROS.  ROS   Medical History: Past Medical History:  Diagnosis Date  Anemia  History of cancer   Patient Active Problem List  Diagnosis  Contour irregularity in reconstructed breast  Ductal carcinoma in situ (DCIS) of left breast  Malignant neoplasm of female breast (CMS/HHS-HCC)  Malignant neoplasm of upper-outer quadrant of left breast in female, estrogen receptor positive (CMS/HHS-HCC)  S/P breast reconstruction   Past Surgical History:  Procedure Laterality Date  .Left Breast Biopsy Left 04/10/2024  DILATION AND CURETTAGE, DIAGNOSTIC / THERAPEUTIC N/A    Allergies  Allergen Reactions  Codeine Hives  Morphine Hives  Prednisone  Rash  Rash  reportedly due to oral prednisone  - no known issue w/ topicals.   Weight loss, rash, bone pain  Weight loss, rash, bone pain Weight loss, rash, bone pain  Shellfish Containing Products Hives   Current Outpatient Medications on File Prior to Visit  Medication Sig Dispense Refill  aspirin-acetaminophen -caffeine (EXCEDRIN MIGRAINE) 250-250-65 mg per tablet Take 1 tablet by mouth every 6 (six) hours as needed  multivitamin with iron-minerals (SUPER THERA VITE M) tablet Take 1 tablet by mouth once daily  pantoprazole  (PROTONIX ) 40 MG DR tablet Take 40 mg by mouth once daily   No current facility-administered medications on file prior to visit.   Family History  Problem Relation Age of Onset  Coronary Artery Disease (Blocked arteries around heart) Mother    Social History   Tobacco Use  Smoking Status Former  Types: Cigarettes  Smokeless Tobacco Never  Tobacco Comments  Quit smoking cigarettes in 2010    Social History   Socioeconomic History  Marital status: Divorced  Tobacco Use  Smoking status: Former  Types: Cigarettes  Smokeless tobacco: Never  Tobacco comments:  Quit smoking cigarettes in 2010  Vaping Use  Vaping status: Never Used   Objective:  There were no vitals filed for this visit.  There is no height or weight on file to calculate BMI.  Physical Exam Vitals reviewed.  Constitutional:  General: She is not in acute distress. Appearance: Normal appearance.  HENT:  Head: Normocephalic and atraumatic.  Right Ear: External ear normal.  Left Ear: External ear normal.  Nose: Nose normal.  Mouth/Throat:  Mouth: Mucous membranes are moist.  Pharynx: Oropharynx is clear.  Eyes:  General: No scleral icterus. Extraocular Movements: Extraocular movements intact.  Conjunctiva/sclera: Conjunctivae normal.  Pupils: Pupils are equal, round, and reactive to light.  Cardiovascular:  Rate and Rhythm: Normal rate and regular rhythm.  Pulses: Normal pulses.  Heart  sounds: Normal heart sounds.  Pulmonary:  Effort: Pulmonary effort is normal. No respiratory distress.  Breath sounds: Normal breath sounds.  Abdominal:  General: Bowel sounds are normal.  Palpations: Abdomen is soft.  Tenderness: There is no abdominal tenderness.  Musculoskeletal:  General: No swelling, tenderness or deformity. Normal range of motion.  Cervical back: Normal range of motion and neck supple.  Skin: General: Skin is warm and dry.  Coloration: Skin is not jaundiced.  Neurological:  General: No focal deficit present.  Mental Status: She is alert and oriented to [place], place, and time.  Psychiatric:  Mood and Affect: Mood normal.  Behavior: Behavior normal.     Breast: There is a 2 to 3 cm palpable mass in the upper outer left reconstructed breast. There is no palpable mass in the right constructed breast. There is no palpable axillary, supraclavicular, or cervical lymphadenopathy.  Labs, Imaging and Diagnostic Testing:  Assessment and Plan:   Diagnoses and all orders for this visit:  Malignant neoplasm of upper-outer quadrant of left breast in female, estrogen receptor positive (CMS/HHS-HCC) - [facility] Case Posting Request; Future   The patient appears to have a 2.2 cm recurrence in the upper outer quadrant of the left reconstructed breast. At this point I would recommend wide excision of the area. She will need an MRI to make sure that the recurrent cancer is not sitting on her implant or capsule. I will also refer her to plastic surgery so they can review her information and be available should we need them during the excision. She will also meet with medical and radiation oncology to discuss adjuvant therapy and staging we will move forward with surgical scheduling once the MRIs done

## 2024-06-10 NOTE — Op Note (Signed)
 06/10/2024  9:28 AM  PATIENT:  Amber Villarreal  48 y.o. female  PRE-OPERATIVE DIAGNOSIS:  RECURRENT LEFT BREAST CANCER  POST-OPERATIVE DIAGNOSIS:  RECURRENT LEFT BREAST CANCER  PROCEDURE:  Procedures with comments: LEFT BREAST RADIOACTIVE SEED LOCALIZED LUMPECTOMY  SURGEON:  Surgeons and Role:    * Curvin Deward MOULD, MD - Primary  PHYSICIAN ASSISTANT:   ASSISTANTS: none   ANESTHESIA:   local and general  EBL:  10 mL   BLOOD ADMINISTERED:none  DRAINS: none   LOCAL MEDICATIONS USED:  MARCAINE      SPECIMEN:  Source of Specimen:  left breast tissue  DISPOSITION OF SPECIMEN:  PATHOLOGY  COUNTS:  YES  TOURNIQUET:  * No tourniquets in log *  DICTATION: .Dragon Dictation  After informed consent was obtained the patient was brought to the operating room and placed in the supine position on the operating table.  After adequate induction of general anesthesia the patient's left chest, breast, and axillary area were prepped with ChloraPrep, allowed to dry, and draped in usual sterile manner.  An appropriate timeout was performed.  Previously an I-125 seed was placed in the upper outer aspect of the left reconstructed breast to mark an area of recurrent breast cancer.  The neoprobe was set to I-125 and the area of radioactivity was readily identified.  The area around this superficially was infiltrated with quarter percent Marcaine .  A curvilinear incision was made along the upper outer edge of the reconstructed breast with a 15 blade knife overlying the area of radioactivity.  The incision was carried through the skin and subcutaneous tissue sharply with the electrocautery.  Once I more closely approached the radioactive seed I could also feel the cancer.  I dissected widely around the palpable mass and radioactive seed while checking the area of radioactivity frequently.  This dissection was carried all the way to the capsule of the implant.  Once the tissue was removed it was oriented with  the appropriate paint colors.  A specimen radiograph was obtained that showed the seed to be within the specimen.  The specimen was then sent to pathology for further evaluation.  Hemostasis was achieved using the Bovie electrocautery.  The wound was irrigated with saline and infiltrated with 1/4% Marcaine .  I could not close the deepest layer of the incision because I could not sew to the capsule of the implant.  The more superficial subcutaneous tissue was closed with interrupted 3-0 Vicryl stitches.  The skin was closed with a running 4-0 Monocryl subcuticular stitch.  Dermabond dressings were applied.  The patient tolerated the procedure well.  At the end of the case all needle sponge and instrument counts were correct.  The patient was then awakened and taken to recovery in stable condition.  PLAN OF CARE: Discharge to home after PACU  PATIENT DISPOSITION:  PACU - hemodynamically stable.   Delay start of Pharmacological VTE agent (>24hrs) due to surgical blood loss or risk of bleeding: not applicable

## 2024-06-10 NOTE — Anesthesia Postprocedure Evaluation (Signed)
 Anesthesia Post Note  Patient: Amber Villarreal  Procedure(s) Performed: RADIOACTIVE SEED GUIDED BREAST BIOPSY (Left: Breast)     Patient location during evaluation: PACU Anesthesia Type: General Level of consciousness: awake and alert Pain management: pain level controlled Vital Signs Assessment: post-procedure vital signs reviewed and stable Respiratory status: spontaneous breathing, nonlabored ventilation and respiratory function stable Cardiovascular status: blood pressure returned to baseline and stable Postop Assessment: no apparent nausea or vomiting Anesthetic complications: no   No notable events documented.  Last Vitals:  Vitals:   06/10/24 0945 06/10/24 1009  BP: (!) 142/91 (!) 141/94  Pulse: 81 73  Resp: 17 20  Temp:  36.5 C  SpO2: 100% 99%    Last Pain:  Vitals:   06/10/24 1009  TempSrc: Temporal  PainSc: 0-No pain                 Antwione Picotte,W. EDMOND

## 2024-06-10 NOTE — Transfer of Care (Signed)
 Immediate Anesthesia Transfer of Care Note  Patient: Amber Villarreal  Procedure(s) Performed: RADIOACTIVE SEED GUIDED BREAST BIOPSY (Left: Breast)  Patient Location: PACU  Anesthesia Type:General  Level of Consciousness: awake, alert , oriented, and patient cooperative  Airway & Oxygen Therapy: Patient Spontanous Breathing and Patient connected to face mask oxygen  Post-op Assessment: Report given to RN and Post -op Vital signs reviewed and stable  Post vital signs: Reviewed and stable  Last Vitals:  Vitals Value Taken Time  BP    Temp    Pulse 84 06/10/24 09:35  Resp 14 06/10/24 09:35  SpO2 100 % 06/10/24 09:35  Vitals shown include unfiled device data.  Last Pain:  Vitals:   06/10/24 0719  TempSrc: Tympanic  PainSc: 0-No pain      Patients Stated Pain Goal: 6 (06/10/24 0719)  Complications: No notable events documented.

## 2024-06-10 NOTE — Anesthesia Procedure Notes (Signed)
 Procedure Name: LMA Insertion Date/Time: 06/10/2024 8:39 AM  Performed by: Kathern Rollene LABOR, CRNAPre-anesthesia Checklist: Patient identified, Emergency Drugs available, Suction available and Patient being monitored Patient Re-evaluated:Patient Re-evaluated prior to induction Oxygen Delivery Method: Circle system utilized Preoxygenation: Pre-oxygenation with 100% oxygen Induction Type: IV induction Ventilation: Mask ventilation without difficulty LMA: LMA inserted LMA Size: 4.0 Tube type: Oral Number of attempts: 1 Placement Confirmation: positive ETCO2 and breath sounds checked- equal and bilateral Tube secured with: Tape Dental Injury: Teeth and Oropharynx as per pre-operative assessment

## 2024-06-10 NOTE — Anesthesia Preprocedure Evaluation (Addendum)
 Anesthesia Evaluation  Patient identified by MRN, date of birth, ID band Patient awake    Reviewed: Allergy & Precautions, H&P , NPO status , Patient's Chart, lab work & pertinent test results  Airway Mallampati: II  TM Distance: >3 FB Neck ROM: Full    Dental no notable dental hx. (+) Teeth Intact, Dental Advisory Given   Pulmonary former smoker   Pulmonary exam normal breath sounds clear to auscultation       Cardiovascular negative cardio ROS  Rhythm:Regular Rate:Normal     Neuro/Psych     Bipolar Disorder   negative neurological ROS     GI/Hepatic negative GI ROS, Neg liver ROS,,,  Endo/Other  negative endocrine ROS    Renal/GU negative Renal ROS  negative genitourinary   Musculoskeletal   Abdominal   Peds  Hematology  (+) Blood dyscrasia, anemia   Anesthesia Other Findings   Reproductive/Obstetrics negative OB ROS                              Anesthesia Physical Anesthesia Plan  ASA: 2  Anesthesia Plan: General   Post-op Pain Management: Tylenol  PO (pre-op)*   Induction: Intravenous  PONV Risk Score and Plan: 4 or greater and Ondansetron , Dexamethasone and Midazolam   Airway Management Planned: LMA  Additional Equipment:   Intra-op Plan:   Post-operative Plan: Extubation in OR  Informed Consent: I have reviewed the patients History and Physical, chart, labs and discussed the procedure including the risks, benefits and alternatives for the proposed anesthesia with the patient or authorized representative who has indicated his/her understanding and acceptance.     Dental advisory given  Plan Discussed with: CRNA  Anesthesia Plan Comments:          Anesthesia Quick Evaluation

## 2024-06-12 LAB — SURGICAL PATHOLOGY

## 2024-06-15 ENCOUNTER — Ambulatory Visit: Payer: Self-pay | Admitting: General Surgery

## 2024-06-29 ENCOUNTER — Encounter: Payer: Self-pay | Admitting: *Deleted

## 2024-07-07 ENCOUNTER — Encounter: Payer: Self-pay | Admitting: *Deleted

## 2024-07-07 ENCOUNTER — Inpatient Hospital Stay: Admitting: Hematology

## 2024-07-21 ENCOUNTER — Inpatient Hospital Stay: Admitting: Hematology

## 2024-07-23 ENCOUNTER — Encounter: Payer: Self-pay | Admitting: *Deleted

## 2024-08-12 ENCOUNTER — Inpatient Hospital Stay: Admitting: Hematology
